# Patient Record
Sex: Female | Born: 1960 | Race: Black or African American | Hispanic: No | Marital: Married | Smoking: Never smoker
Health system: Southern US, Community
[De-identification: ages and names within clinical notes are randomized; demographics above are authoritative.]

## PROBLEM LIST (undated history)

## (undated) DIAGNOSIS — R0600 Dyspnea, unspecified: Secondary | ICD-10-CM

## (undated) DIAGNOSIS — I10 Essential (primary) hypertension: Secondary | ICD-10-CM

## (undated) DIAGNOSIS — E785 Hyperlipidemia, unspecified: Secondary | ICD-10-CM

## (undated) DIAGNOSIS — M5430 Sciatica, unspecified side: Secondary | ICD-10-CM

## (undated) HISTORY — PX: BREAST SURGERY: SHX581

## (undated) HISTORY — PX: ABDOMINAL HYSTERECTOMY: SHX81

---

## 2000-07-13 ENCOUNTER — Emergency Department (HOSPITAL_COMMUNITY): Admission: EM | Admit: 2000-07-13 | Discharge: 2000-07-13 | Payer: Self-pay | Admitting: Internal Medicine

## 2004-12-05 ENCOUNTER — Emergency Department (HOSPITAL_COMMUNITY): Admission: EM | Admit: 2004-12-05 | Discharge: 2004-12-06 | Payer: Self-pay | Admitting: Emergency Medicine

## 2006-11-02 ENCOUNTER — Other Ambulatory Visit: Admission: RE | Admit: 2006-11-02 | Discharge: 2006-11-02 | Payer: Self-pay | Admitting: Family Medicine

## 2008-07-07 ENCOUNTER — Emergency Department (HOSPITAL_COMMUNITY): Admission: EM | Admit: 2008-07-07 | Discharge: 2008-07-07 | Payer: Self-pay | Admitting: Emergency Medicine

## 2008-10-31 ENCOUNTER — Other Ambulatory Visit: Admission: RE | Admit: 2008-10-31 | Discharge: 2008-10-31 | Payer: Self-pay | Admitting: Family Medicine

## 2009-11-19 ENCOUNTER — Encounter (INDEPENDENT_AMBULATORY_CARE_PROVIDER_SITE_OTHER): Payer: Self-pay | Admitting: Obstetrics and Gynecology

## 2009-11-19 ENCOUNTER — Inpatient Hospital Stay (HOSPITAL_COMMUNITY): Admission: RE | Admit: 2009-11-19 | Discharge: 2009-11-21 | Payer: Self-pay | Admitting: Obstetrics and Gynecology

## 2010-07-31 ENCOUNTER — Encounter: Admission: RE | Admit: 2010-07-31 | Discharge: 2010-07-31 | Payer: Self-pay | Admitting: Family Medicine

## 2010-12-09 LAB — CBC
HCT: 29.9 % — ABNORMAL LOW (ref 36.0–46.0)
HCT: 31.1 % — ABNORMAL LOW (ref 36.0–46.0)
Hemoglobin: 10.3 g/dL — ABNORMAL LOW (ref 12.0–15.0)
MCHC: 33.2 g/dL (ref 30.0–36.0)
MCV: 95.8 fL (ref 78.0–100.0)
Platelets: 181 10*3/uL (ref 150–400)
RBC: 3.09 MIL/uL — ABNORMAL LOW (ref 3.87–5.11)
RBC: 3.25 MIL/uL — ABNORMAL LOW (ref 3.87–5.11)
RDW: 13.1 % (ref 11.5–15.5)
WBC: 9.9 10*3/uL (ref 4.0–10.5)

## 2010-12-09 LAB — BASIC METABOLIC PANEL
BUN: 11 mg/dL (ref 6–23)
Calcium: 9.1 mg/dL (ref 8.4–10.5)
Creatinine, Ser: 0.8 mg/dL (ref 0.4–1.2)

## 2010-12-09 LAB — TYPE AND SCREEN: ABO/RH(D): O POS

## 2010-12-09 LAB — ABO/RH: ABO/RH(D): O POS

## 2011-06-17 LAB — COMPREHENSIVE METABOLIC PANEL
CO2: 24
Calcium: 9.2
Creatinine, Ser: 0.99
GFR calc non Af Amer: >60
Glucose, Bld: 87

## 2011-06-17 LAB — LIPASE, BLOOD: Lipase: 23

## 2011-06-17 LAB — URINALYSIS, ROUTINE W REFLEX MICROSCOPIC
Bilirubin Urine: NEGATIVE
Glucose, UA: NEGATIVE
Nitrite: NEGATIVE
Specific Gravity, Urine: 1.022
pH: 5.5

## 2011-06-17 LAB — URINE MICROSCOPIC-ADD ON

## 2011-06-17 LAB — POCT CARDIAC MARKERS
CKMB, poc: <1 — ABNORMAL LOW
Myoglobin, poc: 53.6
Troponin i, poc: <0.05

## 2011-08-29 ENCOUNTER — Other Ambulatory Visit: Payer: Self-pay | Admitting: Obstetrics and Gynecology

## 2011-08-29 DIAGNOSIS — Z1231 Encounter for screening mammogram for malignant neoplasm of breast: Secondary | ICD-10-CM

## 2011-10-02 ENCOUNTER — Ambulatory Visit: Payer: Self-pay

## 2011-11-06 ENCOUNTER — Other Ambulatory Visit: Payer: Self-pay | Admitting: Family Medicine

## 2011-11-06 DIAGNOSIS — N6310 Unspecified lump in the right breast, unspecified quadrant: Secondary | ICD-10-CM

## 2011-11-28 ENCOUNTER — Ambulatory Visit
Admission: RE | Admit: 2011-11-28 | Discharge: 2011-11-28 | Disposition: A | Discharge status: Home / Self Care | Payer: Self-pay | Source: Ambulatory Visit | Attending: Family Medicine | Admitting: Family Medicine

## 2011-11-28 ENCOUNTER — Ambulatory Visit
Admission: RE | Admit: 2011-11-28 | Discharge: 2011-11-28 | Disposition: A | Discharge status: Home / Self Care | Payer: 59 | Source: Ambulatory Visit | Attending: Family Medicine | Admitting: Family Medicine

## 2011-11-28 ENCOUNTER — Other Ambulatory Visit: Payer: Self-pay | Admitting: Obstetrics and Gynecology

## 2011-11-28 DIAGNOSIS — N6310 Unspecified lump in the right breast, unspecified quadrant: Secondary | ICD-10-CM

## 2011-11-28 DIAGNOSIS — Z1231 Encounter for screening mammogram for malignant neoplasm of breast: Secondary | ICD-10-CM

## 2011-12-04 ENCOUNTER — Ambulatory Visit
Admission: RE | Admit: 2011-12-04 | Discharge: 2011-12-04 | Disposition: A | Discharge status: Home / Self Care | Payer: 59 | Source: Ambulatory Visit | Attending: Obstetrics and Gynecology | Admitting: Obstetrics and Gynecology

## 2011-12-04 DIAGNOSIS — Z1231 Encounter for screening mammogram for malignant neoplasm of breast: Secondary | ICD-10-CM

## 2011-12-05 ENCOUNTER — Other Ambulatory Visit: Payer: 59

## 2012-01-12 ENCOUNTER — Other Ambulatory Visit: Payer: Self-pay | Admitting: Obstetrics and Gynecology

## 2012-01-12 DIAGNOSIS — N644 Mastodynia: Secondary | ICD-10-CM

## 2012-01-13 ENCOUNTER — Ambulatory Visit
Admission: RE | Admit: 2012-01-13 | Discharge: 2012-01-13 | Disposition: A | Discharge status: Home / Self Care | Payer: 59 | Source: Ambulatory Visit | Attending: Obstetrics and Gynecology | Admitting: Obstetrics and Gynecology

## 2012-01-13 DIAGNOSIS — N644 Mastodynia: Secondary | ICD-10-CM

## 2012-09-21 ENCOUNTER — Other Ambulatory Visit: Payer: Self-pay | Admitting: Family Medicine

## 2012-09-21 DIAGNOSIS — N63 Unspecified lump in unspecified breast: Secondary | ICD-10-CM

## 2012-09-29 ENCOUNTER — Ambulatory Visit
Admission: RE | Admit: 2012-09-29 | Discharge: 2012-09-29 | Disposition: A | Discharge status: Home / Self Care | Payer: 59 | Source: Ambulatory Visit | Attending: Family Medicine | Admitting: Family Medicine

## 2012-09-29 ENCOUNTER — Other Ambulatory Visit: Payer: Self-pay | Admitting: Family Medicine

## 2012-09-29 DIAGNOSIS — N63 Unspecified lump in unspecified breast: Secondary | ICD-10-CM

## 2013-07-01 ENCOUNTER — Other Ambulatory Visit: Payer: Self-pay | Admitting: Cardiology

## 2013-07-01 ENCOUNTER — Ambulatory Visit
Admission: RE | Admit: 2013-07-01 | Discharge: 2013-07-01 | Disposition: A | Discharge status: Home / Self Care | Payer: 59 | Source: Ambulatory Visit | Attending: Cardiology | Admitting: Cardiology

## 2013-07-01 DIAGNOSIS — R0609 Other forms of dyspnea: Secondary | ICD-10-CM

## 2013-07-07 ENCOUNTER — Institutional Professional Consult (permissible substitution): Payer: 59 | Admitting: Cardiology

## 2016-01-09 DIAGNOSIS — N62 Hypertrophy of breast: Secondary | ICD-10-CM | POA: Insufficient documentation

## 2016-12-05 DIAGNOSIS — E7800 Pure hypercholesterolemia, unspecified: Secondary | ICD-10-CM | POA: Diagnosis not present

## 2016-12-05 DIAGNOSIS — I10 Essential (primary) hypertension: Secondary | ICD-10-CM | POA: Diagnosis not present

## 2016-12-05 DIAGNOSIS — J301 Allergic rhinitis due to pollen: Secondary | ICD-10-CM | POA: Diagnosis not present

## 2016-12-31 DIAGNOSIS — E7800 Pure hypercholesterolemia, unspecified: Secondary | ICD-10-CM | POA: Diagnosis not present

## 2016-12-31 DIAGNOSIS — D538 Other specified nutritional anemias: Secondary | ICD-10-CM | POA: Diagnosis not present

## 2017-01-01 ENCOUNTER — Encounter (HOSPITAL_COMMUNITY): Payer: Self-pay

## 2017-01-01 ENCOUNTER — Emergency Department (HOSPITAL_COMMUNITY): Payer: Commercial Managed Care - HMO

## 2017-01-01 DIAGNOSIS — R0602 Shortness of breath: Secondary | ICD-10-CM | POA: Diagnosis not present

## 2017-01-01 DIAGNOSIS — R0789 Other chest pain: Secondary | ICD-10-CM | POA: Diagnosis not present

## 2017-01-01 DIAGNOSIS — Z79899 Other long term (current) drug therapy: Secondary | ICD-10-CM | POA: Insufficient documentation

## 2017-01-01 DIAGNOSIS — I10 Essential (primary) hypertension: Secondary | ICD-10-CM | POA: Diagnosis not present

## 2017-01-01 DIAGNOSIS — R079 Chest pain, unspecified: Secondary | ICD-10-CM | POA: Diagnosis present

## 2017-01-01 LAB — BASIC METABOLIC PANEL
ANION GAP: 10 (ref 5–15)
BUN: 16 mg/dL (ref 6–20)
CALCIUM: 9.7 mg/dL (ref 8.9–10.3)
CO2: 24 mmol/L (ref 22–32)
Chloride: 105 mmol/L (ref 101–111)
Creatinine, Ser: 0.87 mg/dL (ref 0.44–1.00)
GFR calc non Af Amer: >60 mL/min (ref >60)
GLUCOSE: 89 mg/dL (ref 65–99)
Potassium: 4 mmol/L (ref 3.5–5.1)
Sodium: 139 mmol/L (ref 135–145)

## 2017-01-01 LAB — CBC
HCT: 34.4 % — ABNORMAL LOW (ref 36.0–46.0)
HEMOGLOBIN: 11.2 g/dL — AB (ref 12.0–15.0)
MCH: 30.3 pg (ref 26.0–34.0)
MCHC: 32.6 g/dL (ref 30.0–36.0)
MCV: 93 fL (ref 78.0–100.0)
Platelets: 259 10*3/uL (ref 150–400)
RBC: 3.7 MIL/uL — ABNORMAL LOW (ref 3.87–5.11)
RDW: 12.1 % (ref 11.5–15.5)
WBC: 5.1 10*3/uL (ref 4.0–10.5)

## 2017-01-01 LAB — TROPONIN I

## 2017-01-01 NOTE — ED Triage Notes (Signed)
Pt endorses left sided chest pain with radiation to the back and left hand numbness that began last evening. Pt denies any other associated sx. VSS.

## 2017-01-02 ENCOUNTER — Encounter (HOSPITAL_COMMUNITY): Payer: Self-pay | Admitting: Internal Medicine

## 2017-01-02 ENCOUNTER — Observation Stay (HOSPITAL_BASED_OUTPATIENT_CLINIC_OR_DEPARTMENT_OTHER): Payer: Commercial Managed Care - HMO

## 2017-01-02 ENCOUNTER — Observation Stay (HOSPITAL_COMMUNITY)
Admission: EM | Admit: 2017-01-02 | Discharge: 2017-01-02 | Disposition: A | Discharge status: Home / Self Care | Payer: Commercial Managed Care - HMO | Attending: Internal Medicine | Admitting: Internal Medicine

## 2017-01-02 DIAGNOSIS — E7800 Pure hypercholesterolemia, unspecified: Secondary | ICD-10-CM | POA: Diagnosis not present

## 2017-01-02 DIAGNOSIS — Z79899 Other long term (current) drug therapy: Secondary | ICD-10-CM | POA: Diagnosis not present

## 2017-01-02 DIAGNOSIS — R079 Chest pain, unspecified: Secondary | ICD-10-CM | POA: Diagnosis not present

## 2017-01-02 DIAGNOSIS — M5431 Sciatica, right side: Secondary | ICD-10-CM

## 2017-01-02 DIAGNOSIS — I10 Essential (primary) hypertension: Secondary | ICD-10-CM | POA: Diagnosis not present

## 2017-01-02 DIAGNOSIS — E785 Hyperlipidemia, unspecified: Secondary | ICD-10-CM | POA: Diagnosis present

## 2017-01-02 DIAGNOSIS — M5430 Sciatica, unspecified side: Secondary | ICD-10-CM | POA: Diagnosis present

## 2017-01-02 DIAGNOSIS — R0789 Other chest pain: Secondary | ICD-10-CM | POA: Diagnosis not present

## 2017-01-02 HISTORY — DX: Essential (primary) hypertension: I10

## 2017-01-02 HISTORY — DX: Sciatica, unspecified side: M54.30

## 2017-01-02 HISTORY — DX: Hyperlipidemia, unspecified: E78.5

## 2017-01-02 LAB — CREATININE, SERUM: CREATININE: 0.85 mg/dL (ref 0.44–1.00)

## 2017-01-02 LAB — LIPID PANEL
CHOL/HDL RATIO: 2.6 ratio
Cholesterol: 180 mg/dL (ref 0–200)
HDL: 70 mg/dL (ref >40)
LDL CALC: 104 mg/dL — AB (ref 0–99)
TRIGLYCERIDES: 31 mg/dL (ref <150)
VLDL: 6 mg/dL (ref 0–40)

## 2017-01-02 LAB — IRON AND TIBC
Iron: 74 ug/dL (ref 28–170)
Saturation Ratios: 22 % (ref 10.4–31.8)
TIBC: 332 ug/dL (ref 250–450)
UIBC: 258 ug/dL

## 2017-01-02 LAB — NM MYOCAR MULTI W/SPECT W/WALL MOTION / EF
CHL CUP MPHR: 165 {beats}/min
CSEPHR: 59 %
Exercise duration (min): 6 min
Exercise duration (sec): 53 s
Peak HR: 98 {beats}/min
Rest HR: 59 {beats}/min

## 2017-01-02 LAB — CBC
HCT: 32.1 % — ABNORMAL LOW (ref 36.0–46.0)
Hemoglobin: 10.4 g/dL — ABNORMAL LOW (ref 12.0–15.0)
MCH: 30 pg (ref 26.0–34.0)
MCHC: 32.4 g/dL (ref 30.0–36.0)
MCV: 92.5 fL (ref 78.0–100.0)
PLATELETS: 245 10*3/uL (ref 150–400)
RBC: 3.47 MIL/uL — ABNORMAL LOW (ref 3.87–5.11)
RDW: 12.2 % (ref 11.5–15.5)
WBC: 4.2 10*3/uL (ref 4.0–10.5)

## 2017-01-02 LAB — FERRITIN: Ferritin: 62 ng/mL (ref 11–307)

## 2017-01-02 LAB — HIV ANTIBODY (ROUTINE TESTING W REFLEX): HIV SCREEN 4TH GENERATION: NONREACTIVE

## 2017-01-02 LAB — TROPONIN I: Troponin I: <0.03 ng/mL (ref <0.03)

## 2017-01-02 MED ORDER — TECHNETIUM TC 99M TETROFOSMIN IV KIT
10.0000 | PACK | Freq: Once | INTRAVENOUS | Status: AC | PRN
  Administered 2017-01-02: 10 via INTRAVENOUS

## 2017-01-02 MED ORDER — ONDANSETRON HCL 4 MG/2ML IJ SOLN: 4.0000 mg | Freq: Four times a day (QID) | INTRAMUSCULAR | Status: DC | PRN

## 2017-01-02 MED ORDER — ATORVASTATIN CALCIUM 20 MG PO TABS: 20.0000 mg | ORAL_TABLET | Freq: Every day | ORAL | Status: DC

## 2017-01-02 MED ORDER — ASPIRIN EC 325 MG PO TBEC
325.0000 mg | DELAYED_RELEASE_TABLET | Freq: Every day | ORAL | Status: DC
  Administered 2017-01-02: 325 mg via ORAL
  Filled 2017-01-02: qty 1

## 2017-01-02 MED ORDER — AMLODIPINE BESYLATE 10 MG PO TABS: 10.0000 mg | ORAL_TABLET | Freq: Every day | ORAL | 0 refills | Status: AC

## 2017-01-02 MED ORDER — ACETAMINOPHEN 325 MG PO TABS: 650.0000 mg | ORAL_TABLET | ORAL | Status: DC | PRN

## 2017-01-02 MED ORDER — GI COCKTAIL ~~LOC~~: 30.0000 mL | Freq: Four times a day (QID) | ORAL | Status: DC | PRN

## 2017-01-02 MED ORDER — METOPROLOL TARTRATE 12.5 MG HALF TABLET
12.5000 mg | ORAL_TABLET | Freq: Two times a day (BID) | ORAL | Status: DC
Start: 2017-01-02 — End: 2017-01-02
  Administered 2017-01-02: 12.5 mg via ORAL
  Filled 2017-01-02: qty 1

## 2017-01-02 MED ORDER — ENOXAPARIN SODIUM 40 MG/0.4ML ~~LOC~~ SOLN
40.0000 mg | SUBCUTANEOUS | Status: DC
Start: 2017-01-02 — End: 2017-01-02
  Administered 2017-01-02: 40 mg via SUBCUTANEOUS
  Filled 2017-01-02: qty 0.4

## 2017-01-02 MED ORDER — METOPROLOL TARTRATE 25 MG PO TABS: 25.0000 mg | ORAL_TABLET | Freq: Two times a day (BID) | ORAL | 0 refills | Status: DC

## 2017-01-02 MED ORDER — TECHNETIUM TC 99M TETROFOSMIN IV KIT: 30.0000 | PACK | Freq: Once | INTRAVENOUS | Status: DC | PRN

## 2017-01-02 MED ORDER — REGADENOSON 0.4 MG/5ML IV SOLN
0.4000 mg | Freq: Once | INTRAVENOUS | Status: DC
  Filled 2017-01-02: qty 5

## 2017-01-02 MED ORDER — AMLODIPINE BESYLATE 5 MG PO TABS
5.0000 mg | ORAL_TABLET | Freq: Every day | ORAL | Status: DC
  Administered 2017-01-02: 5 mg via ORAL
  Filled 2017-01-02: qty 1

## 2017-01-02 MED ORDER — REGADENOSON 0.4 MG/5ML IV SOLN
INTRAVENOUS | Status: AC
  Filled 2017-01-02: qty 5

## 2017-01-02 MED ORDER — AMLODIPINE BESYLATE 10 MG PO TABS: 10.0000 mg | ORAL_TABLET | Freq: Every day | ORAL | Status: DC

## 2017-01-02 MED ORDER — NITROGLYCERIN 0.4 MG SL SUBL
0.4000 mg | SUBLINGUAL_TABLET | SUBLINGUAL | Status: DC | PRN
  Administered 2017-01-02: 0.4 mg via SUBLINGUAL
  Filled 2017-01-02: qty 1

## 2017-01-02 MED ORDER — ASPIRIN 81 MG PO CHEW
324.0000 mg | CHEWABLE_TABLET | Freq: Once | ORAL | Status: AC
  Administered 2017-01-02: 324 mg via ORAL
  Filled 2017-01-02: qty 4

## 2017-01-02 MED ORDER — SODIUM CHLORIDE 0.9 % IV SOLN
INTRAVENOUS | Status: AC
  Administered 2017-01-02: 75 mL/h via INTRAVENOUS

## 2017-01-02 MED ORDER — ASPIRIN EC 81 MG PO TBEC: 81.0000 mg | DELAYED_RELEASE_TABLET | Freq: Every day | ORAL | 0 refills | Status: DC

## 2017-01-02 NOTE — Discharge Instructions (Signed)
Follow with Primary MD  in 7 days  ° °Get CBC, CMP, 2 view Chest X ray checked  by Primary MD or SNF MD in 5-7 days ( we routinely change or add medications that can affect your baseline labs and fluid status, therefore we recommend that you get the mentioned basic workup next visit with your PCP, your PCP may decide not to get them or add new tests based on their clinical decision) ° °Activity: As tolerated with Full fall precautions use walker/cane & assistance as needed ° °Disposition Home   ° °Diet:   Heart Healthy   ° °For Heart failure patients - Check your Weight same time everyday, if you gain over 2 pounds, or you develop in leg swelling, experience more shortness of breath or chest pain, call your Primary MD immediately. Follow Cardiac Low Salt Diet and 1.5 lit/day fluid restriction. ° °On your next visit with your primary care physician please Get Medicines reviewed and adjusted. ° °Please request your Prim.MD to go over all Hospital Tests and Procedure/Radiological results at the follow up, please get all Hospital records sent to your Prim MD by signing hospital release before you go home. ° °If you experience worsening of your admission symptoms, develop shortness of breath, life threatening emergency, suicidal or homicidal thoughts you must seek medical attention immediately by calling 911 or calling your MD immediately  if symptoms less severe. ° °You Must read complete instructions/literature along with all the possible adverse reactions/side effects for all the Medicines you take and that have been prescribed to you. Take any new Medicines after you have completely understood and accpet all the possible adverse reactions/side effects.  ° °Do not drive, operate heavy machinery, perform activities at heights, swimming or participation in water activities or provide baby sitting services if your were admitted for syncope or siezures until you have seen by Primary MD or a Neurologist and advised to do  so again. ° °Do not drive when taking Pain medications.  ° ° °Do not take more than prescribed Pain, Sleep and Anxiety Medications ° °Special Instructions: If you have smoked or chewed Tobacco  in the last 2 yrs please stop smoking, stop any regular Alcohol  and or any Recreational drug use. ° °Wear Seat belts while driving. ° ° °Please note ° °You were cared for by a hospitalist during your hospital stay. If you have any questions about your discharge medications or the care you received while you were in the hospital after you are discharged, you can call the unit and asked to speak with the hospitalist on call if the hospitalist that took care of you is not available. Once you are discharged, your primary care physician will handle any further medical issues. Please note that NO REFILLS for any discharge medications will be authorized once you are discharged, as it is imperative that you return to your primary care physician (or establish a relationship with a primary care physician if you do not have one) for your aftercare needs so that they can reassess your need for medications and monitor your lab values. ° °

## 2017-01-02 NOTE — Consult Note (Signed)
Cardiology Consult Note  Admit date: 01/02/2017 Name: Claudia Barrett 56 y.o.  female DOB:  1961-04-01 MRN:  [id number]  Today's date:  01/02/2017  Referring Physician:    [facility]  Reason for Consultation:   Chest pain   IMPRESSIONS: 1.  Chest pain with some typical other atypical symptoms with negative initial enzymes and EKG-she has had a stress test done and await results at this time 2.  Hypertensive heart disease 3.  Hyperlipidemia under treatment 4.  Anemia of uncertain etiology  RECOMMENDATION: Await results of stress testing.  She had a negative treadmill test last year.  The stress test done with Eugenie Birks is pending.  I will review the stress test.  Her blood pressure is mildly elevated and if the stress test is low risk we could intensify medical therapy and follow.  If she has abnormalities on the stress test will need to proceed with further investigation.  HISTORY: This very nice 56 year old [place] female was seen by me last summer.  At the time she had a normal stress test but only walked 6 minutes.  She was obese but has lost 49 pounds voluntarily since then.  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  She had recurrence of chest pain with some radiation to her posterior back and some numbness in her left arm.  She also had some pain involving her feet.  Office but we did not have an opening to see her ester day.  She was advised to go to the emergency room if she had recurrent pain and she had some numbness in her left hand as well as recurrent discomfort last night.  She has some associated sweating with that and had a nitroglycerin that improved her symptoms.  She has had no recurrent symptoms since then and has had a normal EKG twice as well as negative troponins.  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  She normally denies PND orthopnea or edema.    Past Medical History:   Diagnosis Date  . HLD (hyperlipidemia)   . Hypertension   . Sciatica       Past Surgical History:  Procedure Laterality Date  . ABDOMINAL HYSTERECTOMY    . BREAST SURGERY      Allergies:  is allergic to codeine and sulfur.   Medications: Prior to Admission medications   Medication Sig Start Date End Date Taking? Authorizing Provider  amLODipine (NORVASC) 5 MG tablet Take 5 mg by mouth daily. 10/11/15  Yes Historical Provider, MD  atorvastatin (LIPITOR) 20 MG tablet Take 20 mg by mouth daily. 12/03/16  Yes Historical Provider, MD  meloxicam (MOBIC) 15 MG tablet Take 15 mg by mouth daily. 12/01/16  Yes Historical Provider, MD    Family History: Family Status  Relation Status  . Mother   . Father   . Neg Hx     Social History:   reports that she has never smoked. She has never used smokeless tobacco. She reports that she does not drink alcohol or use drugs.   Review of Systems: Other than as noted above remainder review of systems is negative  Physical Exam: BP (!) 148/79   Pulse 94   Temp 97.6 F (36.4 C) (Oral)   Resp 17   Ht  (1.676 m)   Wt 84.1 kg (185 lb 6.5 oz)   SpO2 100%   BMI 29.93 kg/m   General appearance: Pleasant [place] female  in no acute distress Head: Normocephalic, without obvious abnormality, atraumatic Eyes: conjunctivae/corneas clear. PERRL, EOM's intact. Fundi not examined Neck: no adenopathy, no carotid bruit, no JVD and supple, symmetrical, trachea midline Lungs: clear to auscultation bilaterally Heart: regular rate and rhythm, S1, S2 normal, no murmur, click, rub or gallop Abdomen: soft, non-tender; bowel sounds normal; no masses,  no organomegaly Pelvic: deferred Extremities: extremities normal, atraumatic, no cyanosis or edema Pulses: 2+ and symmetric Skin: Skin color, texture, turgor normal. No rashes or lesions Neurologic: Grossly normal Psych: Alert and oriented x 3 Labs: CBC  Recent Labs  01/02/17 0522  WBC 4.2  RBC 3.47*   HGB 10.4*  HCT 32.1*  PLT 245  MCV 92.5  MCH 30.0  MCHC 32.4  RDW 12.2   CMP   Recent Labs  01/01/17 2048 01/02/17 0522  NA 139  --   K 4.0  --   CL 105  --   CO2 24  --   GLUCOSE 89  --   BUN 16  --   CREATININE 0.87 0.85  CALCIUM 9.7  --   GFRNONAA >60 >60  GFRAA >60 >60   Cardiac Panel (last 3 results)  Recent Labs  01/01/17 2048 01/02/17 0522  TROPONINI <0.03 <0.03  <0.03     Radiology:  Normal  EKG: Sinus rhythm, normal Independently reviewed by me  Signed:  W. Ashley Royalty MD [facility]   Cardiology Consultant  01/02/2017, 1:35 PM

## 2017-01-02 NOTE — Progress Notes (Signed)
Pt received discharge information and education. Pt verbalized understanding. Family at bedside.

## 2017-01-02 NOTE — ED Provider Notes (Signed)
[facility]-EMERGENCY DEPT Provider Note   CSN: [id number] Arrival date & time: 01/01/17  2035  By signing my name below, I, Elder Negus, attest that this documentation has been prepared under the direction and in the presence of Gilda Crease, MD. Electronically Signed: Elder Negus, Scribe. 01/02/17. 12:32 AM.   History   Chief Complaint Chief Complaint  Patient presents with  . Chest Pain    HPI Claudia Barrett is a 56 y.o. female with history of hypertension who presents to the ED for evaluation of chest pain. This patient states that in the last 24-36 hours she has experienced exertional, central chest pain without radiation or associated symptoms. Not worse when palpating or moving her arm. She states that today while "sweeping" her chest pain worsened; she "rested for a few minutes" and her symptoms resolved. At interview while at rest, she states that her symptoms are mild. She denies any cardiac history. Grandmother had prior MI; no immediate family. She is not a tobacco user. She did have a stress test around 1 year ago that demonstrated "a leaky valve and fat on the heart". No ischemia.  The history is provided by the patient. No language interpreter was used.    Past Medical History:  Diagnosis Date  . Hypertension     There are no active problems to display for this patient.   Past Surgical History:  Procedure Laterality Date  . ABDOMINAL HYSTERECTOMY    . BREAST SURGERY      OB History    No data available       Home Medications    Prior to Admission medications   Medication Sig Start Date End Date Taking? Authorizing Provider  amLODipine (NORVASC) 5 MG tablet Take 5 mg by mouth daily. 10/11/15  Yes Historical Provider, MD  atorvastatin (LIPITOR) 20 MG tablet Take 20 mg by mouth daily. 12/03/16  Yes Historical Provider, MD  meloxicam (MOBIC) 15 MG tablet Take 15 mg by mouth daily. 12/01/16  Yes Historical Provider, MD    Family  History History reviewed. No pertinent family history.  Social History Social History  Substance Use Topics  . Smoking status: Never Smoker  . Smokeless tobacco: Never Used  . Alcohol use No     Allergies   Codeine and Sulfur   Review of Systems Review of Systems  Respiratory: Negative for shortness of breath.   Cardiovascular: Positive for chest pain.  All other systems reviewed and are negative.    Physical Exam Updated Vital Signs BP (!) 143/77   Pulse 68   Temp 98.2 F (36.8 C) (Oral)   Resp 13   Ht  (1.676 m)   Wt 187 lb (84.8 kg)   SpO2 100%   BMI 30.18 kg/m   Physical Exam  Constitutional: She is oriented to person, place, and time. She appears well-developed and well-nourished. No distress.  HENT:  Head: Normocephalic and atraumatic.  Right Ear: Hearing normal.  Left Ear: Hearing normal.  Nose: Nose normal.  Mouth/Throat: Oropharynx is clear and moist and mucous membranes are normal.  Eyes: Conjunctivae and EOM are normal. Pupils are equal, round, and reactive to light.  Neck: Normal range of motion. Neck supple.  Cardiovascular: Regular rhythm, S1 normal and S2 normal.  Exam reveals no gallop and no friction rub.   No murmur heard. Pulmonary/Chest: Effort normal and breath sounds normal. No respiratory distress. She exhibits no tenderness.  Abdominal: Soft. Normal appearance and bowel sounds are normal. There is  no hepatosplenomegaly. There is no tenderness. There is no rebound, no guarding, no tenderness at McBurney's point and negative Murphy's sign. No hernia.  Musculoskeletal: Normal range of motion.  Neurological: She is alert and oriented to person, place, and time. She has normal strength. No cranial nerve deficit or sensory deficit. Coordination normal. GCS eye subscore is 4. GCS verbal subscore is 5. GCS motor subscore is 6.  Skin: Skin is warm, dry and intact. No rash noted. No cyanosis.  Psychiatric: She has a normal mood and affect. Her  speech is normal and behavior is normal. Thought content normal.  Nursing note and vitals reviewed.    ED Treatments / Results  Labs (all labs ordered are listed, but only abnormal results are displayed) Labs Reviewed  CBC - Abnormal; Notable for the following:       Result Value   RBC 3.70 (*)    Hemoglobin 11.2 (*)    HCT 34.4 (*)    All other components within normal limits  BASIC METABOLIC PANEL  TROPONIN I    EKG  EKG Interpretation  Date/Time:  Thursday January 01 2017 20:41:33 EDT Ventricular Rate:  66 PR Interval:  164 QRS Duration: 74 QT Interval:  420 QTC Calculation: 440 R Axis:   19 Text Interpretation:  Normal sinus rhythm Low voltage QRS Borderline ECG No significant change since last tracing Confirmed by Breyson Kelm  MD, Sammi Stolarz [phone]) on 01/02/2017 1:23:16 AM       Radiology Dg Chest 2 View  Result Date: 01/01/2017 CLINICAL DATA:  Central chest pain and shortness of breath for 1 day. History of hypertension. Nonsmoker. EXAM: CHEST  2 VIEW COMPARISON:  07/01/2013 FINDINGS: The heart size and mediastinal contours are within normal limits. Both lungs are clear. The visualized skeletal structures are unremarkable. IMPRESSION: No active cardiopulmonary disease. Electronically Signed   By: Burman Nieves M.D.   On: 01/01/2017 21:13    Procedures Procedures (including critical care time)  Medications Ordered in ED Medications  nitroGLYCERIN (NITROSTAT) SL tablet 0.4 mg (0.4 mg Sublingual Given 01/02/17 0058)  aspirin chewable tablet 324 mg (324 mg Oral Given 01/02/17 0057)     Initial Impression / Assessment and Plan / ED Course  I have reviewed the triage vital signs and the nursing notes.  Pertinent labs & imaging results that were available during my care of the patient were reviewed by me and considered in my medical decision making (see chart for details).     Patient presents to the emergency department for evaluation of chest pain. She has been  experiencing intermittent exertional chest pain through the course of today. She reports that while she was at work whenever she would get up and slightly exert herself, such as sweeping, she would develop left-sided chest pain with associated shortness of breath. Symptoms would improve if she sat down and rested for a while, and then recur when she exerted herself.  At arrival to the ER she still had some mild discomfort. She was given aspirin and nitroglycerin. Upon demonstration of nitroglycerin, chest discomfort resolved.  EKG does not show ischemic changes. Initial troponin is negative. Heart score is 4, will require further evaluation.  HEART Pathway for Early Discharge in Acute Chest Pain from [email or web address]  on 01/02/2017 ** All calculations should be rechecked by clinician prior to use **  RESULT SUMMARY: 4 points HEART Pathway Score  High risk 12-65% 30-day MACE  Admit to hospital or observation. Further testing indicated.   INPUTS: History ->  1 = Moderately suspicious (exertional pain) EKG -> 0 = Normal Age -> 1 = 45-64 Risk factors -> 2 = ?3 risk factors or history of atherosclerotic disease (HTN, Chol, obesity) Initial troponin -> 0 = ?normal limit   Final Clinical Impressions(s) / ED Diagnoses   Final diagnoses:  Chest pain, unspecified type    New Prescriptions New Prescriptions   No medications on file  I personally performed the services described in this documentation, which was scribed in my presence. The recorded information has been reviewed and is accurate.    Gilda Crease, MD 01/02/17 [phone]

## 2017-01-02 NOTE — Progress Notes (Addendum)
   Austin Miles Javid presented for a nuclear stress test today.  No immediate complications.  Stress imaging is pending at this time.  Preliminary EKG findings may be listed in the chart, but the stress test result will not be finalized until perfusion imaging is complete.  Pt had bigeminy and frequent PVCs during test. Would recommend checking Mg today, keep > 2.0 and K near 4.0.  Roe Rutherford Ludene Stokke, PA-C 01/02/2017, 10:39 AM

## 2017-01-02 NOTE — H&P (Signed)
History and Physical    Claudia Barrett:[id number] DOB: 08/22/1961 DOA: 01/02/2017  PCP: Dr. Neva Seat Patient coming from: Home   Chief Complaint: Chest pain  HPI: Claudia Barrett is a 56 y.o. female with medical history significant of HTN, HLD, sciatica who presents for chest pain.  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  The pain was in her left chest and radiated to the flank, jaw and down the arm with a numb feeling. The pain felt throbbing in nature and was a 10/10 at it's worst.  Resting helped the pain somewhat, but nitroglycerin given in the ED made the pain resolve.  She had some mild version of this pain yesterday also while exerting herself and this got better with rest.  She had associated diaphoresis, SOB and somnolence.  She has had blurry vision over the last 2 days.  She has not had dizziness and lightheadedness with this episode, but does occasionally have dizziness which causes her to trip.  She has chronic constipation for which she takes milk of magnesia.  She has a FH of her father with heart trouble and needing a pacemaker, but no MI or CAD as far as she knows.  She does not smoke.    ED Course: In the ED, she was given NTG with resolution of her pain.  She had an initial troponin which was normal.  She had a CXR which showed no active disease.  She had an EKG which showed low voltage, but NSR without TWI or ST changes.   Review of Systems: As per HPI otherwise 10 point review of systems negative.    Past Medical History:  Diagnosis Date  . HLD (hyperlipidemia)   . Hypertension   . Sciatica     Past Surgical History:  Procedure Laterality Date  . ABDOMINAL HYSTERECTOMY    . BREAST SURGERY     Reviewed with patient.   reports that she has never smoked. She has never used smokeless tobacco. She reports that she does not drink alcohol or use drugs.  Allergies  Allergen Reactions  . Codeine  Other (See Comments)    Extreme fatigue  . Sulfur Nausea And Vomiting   Reviewed with patient.  Family History  Problem Relation Age of Onset  . Hypertension Mother   . Arthritis Mother   . Arrhythmia Father     needed pacemaker  . Heart attack Neg Hx     Prior to Admission medications   Medication Sig Start Date End Date Taking? Authorizing Provider  amLODipine (NORVASC) 5 MG tablet Take 5 mg by mouth daily. 10/11/15  Yes Historical Provider, MD  atorvastatin (LIPITOR) 20 MG tablet Take 20 mg by mouth daily. 12/03/16  Yes Historical Provider, MD  meloxicam (MOBIC) 15 MG tablet Take 15 mg by mouth daily. 12/01/16  Yes Historical Provider, MD    Physical Exam: Vitals:   01/02/17 0300 01/02/17 0315 01/02/17 0333 01/02/17 0335  BP: 140/80 122/76  (!) 145/82  Pulse:      Resp: (!) 9 17    Temp:    97.7 F (36.5 C)  TempSrc:    Oral  SpO2:      Weight:   185 lb 6.5 oz (84.1 kg)   Height:        Constitutional: NAD, calm, comfortable, lying in bed Vitals:   01/02/17 0300 01/02/17 0315 01/02/17 0333 01/02/17 0335  BP: 140/80 122/76  Marland Kitchen)  145/82  Pulse:      Resp: (!) 9 17    Temp:    97.7 F (36.5 C)  TempSrc:    Oral  SpO2:      Weight:   185 lb 6.5 oz (84.1 kg)   Height:       Eyes: lids and conjunctivae normal, anicteric sclerae ENMT: Mucous membranes are moist. Normal dentition.  Neck: normal, supple, no carotid bruits Respiratory: clear to auscultation bilaterally, no wheezing, no crackles. Normal respiratory effort. Cardiovascular: Normal rate and regular rhythm, no murmurs / rubs / gallops. No extremity edema. 2+ pedal pulses.  Pain is not reproducible.  Abdomen: +BS, NT, ND Musculoskeletal: no clubbing / cyanosis.  Normal muscle tone. Artificial nails Skin: no rashes, lesions, ulcers.  Neurologic: CN grossly intact, non focal Psychiatric: Normal judgment and insight. Alert and oriented x 3. Normal mood.   Labs on Admission: I have personally reviewed following  labs and imaging studies  CBC:  Recent Labs Lab 01/01/17 2048  WBC 5.1  HGB 11.2*  HCT 34.4*  MCV 93.0  PLT 259   Basic Metabolic Panel:  Recent Labs Lab 01/01/17 2048  NA 139  K 4.0  CL 105  CO2 24  GLUCOSE 89  BUN 16  CREATININE 0.87  CALCIUM 9.7   GFR: Estimated Creatinine Clearance: 79.8 mL/min (by C-G formula based on SCr of 0.87 mg/dL). Liver Function Tests: No results for input(s): AST, ALT, ALKPHOS, BILITOT, PROT, ALBUMIN in the last 168 hours. No results for input(s): LIPASE, AMYLASE in the last 168 hours. No results for input(s): AMMONIA in the last 168 hours. Coagulation Profile: No results for input(s): INR, PROTIME in the last 168 hours. Cardiac Enzymes:  Recent Labs Lab 01/01/17 2048  TROPONINI <0.03   BNP (last 3 results) No results for input(s): PROBNP in the last 8760 hours. HbA1C: No results for input(s): HGBA1C in the last 72 hours. CBG: No results for input(s): GLUCAP in the last 168 hours. Lipid Profile: No results for input(s): CHOL, HDL, LDLCALC, TRIG, CHOLHDL, LDLDIRECT in the last 72 hours. Thyroid Function Tests: No results for input(s): TSH, T4TOTAL, FREET4, T3FREE, THYROIDAB in the last 72 hours. Anemia Panel: No results for input(s): VITAMINB12, FOLATE, FERRITIN, TIBC, IRON, RETICCTPCT in the last 72 hours. Urine analysis:    Component Value Date/Time   COLORURINE YELLOW 07/07/2008 0736   APPEARANCEUR HAZY (A) 07/07/2008 0736   LABSPEC 1.022 07/07/2008 0736   PHURINE 5.5 07/07/2008 0736   GLUCOSEU NEGATIVE 07/07/2008 0736   HGBUR NEGATIVE 07/07/2008 0736   BILIRUBINUR NEGATIVE 07/07/2008 0736   KETONESUR NEGATIVE 07/07/2008 0736   PROTEINUR NEGATIVE 07/07/2008 0736   UROBILINOGEN 0.2 07/07/2008 0736   NITRITE NEGATIVE 07/07/2008 0736   LEUKOCYTESUR MODERATE (A) 07/07/2008 0736    Radiological Exams on Admission: Dg Chest 2 View  Result Date: 01/01/2017 CLINICAL DATA:  Central chest pain and shortness of breath for  1 day. History of hypertension. Nonsmoker. EXAM: CHEST  2 VIEW COMPARISON:  07/01/2013 FINDINGS: The heart size and mediastinal contours are within normal limits. Both lungs are clear. The visualized skeletal structures are unremarkable. IMPRESSION: No active cardiopulmonary disease. Electronically Signed   By: Burman Nieves M.D.   On: 01/01/2017 21:13    EKG: Independently reviewed. low voltage, but NSR without TWI or ST changes  Assessment/Plan Exertional chest pain - Admit to obs for ACS rule out - Trend troponin - AM EKG and for recurrent chest pain - Nitro for pain, she is  allergic to codeine so morphine was not ordered - Telemetry - Consider stress test in AM, NPO - Start low dose beta blocker, aspirin - IVF with NS at 75cc/hr while NPO - GI cocktail    HLD (hyperlipidemia) - Continue home atorvastatin - Check lipid panel for risk stratification    Hypertension - BP mildly elevated, she is on amlodipine - continue amlodipine - Low dose beta blocker added    Sciatica - Hold meloxicam - Tylenol for pain  Mild normocytic anemia - Check iron, ferritin.  Treat if deficient.     DVT prophylaxis: Lovenox Code Status: Full Family Communication: Husband at bedside Disposition Plan: plan for 23 hour admission Consults called: None Admission status: Telemetry, obs   Debe Coder MD [facility] Pager [phone]  If 7PM-7AM, please contact night-coverage [email or web address] Password [email or web address]  01/02/2017, 5:35 AM

## 2017-01-02 NOTE — Plan of Care (Signed)
Problem: Pain Managment: Goal: General experience of comfort will improve Outcome: Progressing No c/o pain or discomfort since she arrived to [facility].

## 2017-01-02 NOTE — Discharge Summary (Signed)
Claudia Barrett ZOX:[id number] DOB: 10/05/1960 DOA: 01/02/2017  PCP: No PCP Per Patient  Admit date: 01/02/2017  Discharge date: 01/02/2017  Admitted From: Home   Disposition:  Home   Recommendations for Outpatient Follow-up:   Follow up with PCP in 1-2 weeks  PCP Please obtain BMP/CBC, 2 view CXR in 1week,  (see Discharge instructions)   PCP Please follow up on the following pending results: None   Home Health: None   Equipment/Devices: none  Consultations: Cards Discharge Condition: Stable   CODE STATUS: Full   Diet Recommendation:  Heart Healthy    Chief Complaint  Patient presents with  . Chest Pain     Brief history of present illness from the day of admission and additional interim summary    Claudia Barrett is a 56 y.o. female with medical history significant of HTN, HLD, sciatica who presents for chest pain.  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  The pain was in her left chest and radiated to the flank, jaw and down the arm with a numb feeling                                                                 Hospital Course    1. Chest pain - seen by cards, ruled out for MI, Lexiscan -ve, symptom free, will DC on ASA 81, Lopressor and Norvasc(HTN better control), was seen by Cards as well.  2.Dyslipidemia - on statin  3.HTN - poor control - Norvasc dose increased added Lopressor.    Discharge diagnosis     Active Problems:   HLD (hyperlipidemia)   Hypertension   Sciatica   Chest pain    Discharge instructions    Discharge Instructions    Diet - low sodium heart healthy    Complete by:  As directed    Discharge instructions    Complete by:  As directed    Follow with Primary MD  in 7 days   Get CBC, CMP, 2 view  Chest X ray checked  by Primary MD or SNF MD in 5-7 days ( we routinely change or add medications that can affect your baseline labs and fluid status, therefore we recommend that you get the mentioned basic workup next visit with your PCP, your PCP may decide not to get them or add new tests based on their clinical decision)  Activity: As tolerated with Full fall precautions use walker/cane & assistance as needed  Disposition Home    Diet: Heart Healthy    For Heart failure patients - Check your Weight same time everyday, if you gain over 2 pounds, or you develop in leg swelling, experience more shortness of breath or chest pain, call your Primary MD immediately. Follow Cardiac  Low Salt Diet and 1.5 lit/day fluid restriction.  On your next visit with your primary care physician please Get Medicines reviewed and adjusted.  Please request your Prim.MD to go over all Hospital Tests and Procedure/Radiological results at the follow up, please get all Hospital records sent to your Prim MD by signing hospital release before you go home.  If you experience worsening of your admission symptoms, develop shortness of breath, life threatening emergency, suicidal or homicidal thoughts you must seek medical attention immediately by calling 911 or calling your MD immediately  if symptoms less severe.  You Must read complete instructions/literature along with all the possible adverse reactions/side effects for all the Medicines you take and that have been prescribed to you. Take any new Medicines after you have completely understood and accpet all the possible adverse reactions/side effects.   Do not drive, operate heavy machinery, perform activities at heights, swimming or participation in water activities or provide baby sitting services if your were admitted for syncope or siezures until you have seen by Primary MD or a Neurologist and advised to do so again.  Do not drive when taking Pain medications.     Do not take more than prescribed Pain, Sleep and Anxiety Medications  Special Instructions: If you have smoked or chewed Tobacco  in the last 2 yrs please stop smoking, stop any regular Alcohol  and or any Recreational drug use.  Wear Seat belts while driving.   Please note  You were cared for by a hospitalist during your hospital stay. If you have any questions about your discharge medications or the care you received while you were in the hospital after you are discharged, you can call the unit and asked to speak with the hospitalist on call if the hospitalist that took care of you is not available. Once you are discharged, your primary care physician will handle any further medical issues. Please note that NO REFILLS for any discharge medications will be authorized once you are discharged, as it is imperative that you return to your primary care physician (or establish a relationship with a primary care physician if you do not have one) for your aftercare needs so that they can reassess your need for medications and monitor your lab values.   Increase activity slowly    Complete by:  As directed       Discharge Medications   Allergies as of 01/02/2017      Reactions   Codeine Other (See Comments)   Extreme fatigue   Sulfur Nausea And Vomiting      Medication List    STOP taking these medications   meloxicam 15 MG tablet Commonly known as:  MOBIC     TAKE these medications   amLODipine 10 MG tablet Commonly known as:  NORVASC Take 1 tablet (10 mg total) by mouth daily. What changed:  medication strength  how much to take   aspirin EC 81 MG tablet Take 1 tablet (81 mg total) by mouth daily.   atorvastatin 20 MG tablet Commonly known as:  LIPITOR Take 20 mg by mouth daily.   metoprolol tartrate 25 MG tablet Commonly known as:  LOPRESSOR Take 1 tablet (25 mg total) by mouth 2 (two) times daily.       Follow-up Information    W Viann Fish, MD. Schedule  an appointment as soon as possible for a visit in 1 week(s).   Specialty:  Cardiology Contact information: [address] [place] [address] [place]  [place] [address] [phone]           Major procedures and Radiology Reports - PLEASE review detailed and final reports thoroughly  -        Dg Chest 2 View  Result Date: 01/01/2017 CLINICAL DATA:  Central chest pain and shortness of breath for 1 day. History of hypertension. Nonsmoker. EXAM: CHEST  2 VIEW COMPARISON:  07/01/2013 FINDINGS: The heart size and mediastinal contours are within normal limits. Both lungs are clear. The visualized skeletal structures are unremarkable. IMPRESSION: No active cardiopulmonary disease. Electronically Signed   By: Burman Nieves M.D.   On: 01/01/2017 21:13   Nm Myocar Multi W/spect W/wall Motion / Ef  Result Date: 01/02/2017 CLINICAL DATA:  Chest pain, hypertension, elevated lipids, shortness of breath EXAM: MYOCARDIAL IMAGING WITH SPECT (REST AND PHARMACOLOGIC-STRESS) GATED LEFT VENTRICULAR WALL MOTION STUDY LEFT VENTRICULAR EJECTION FRACTION TECHNIQUE: Standard myocardial SPECT imaging was performed after resting intravenous injection of 10 mCi Tc-66m tetrofosmin. Subsequently, intravenous infusion of Lexiscan was performed under the supervision of the Cardiology staff. At peak effect of the drug, 30 mCi Tc-80m tetrofosmin was injected intravenously and standard myocardial SPECT imaging was performed. Quantitative gated imaging was also performed to evaluate left ventricular wall motion, and estimate left ventricular ejection fraction. COMPARISON:  None. FINDINGS: Perfusion: No decreased activity in the left ventricle on stress imaging to suggest reversible ischemia or infarction. Wall Motion: Normal left ventricular wall motion. No left ventricular dilation. Left Ventricular Ejection Fraction: 56 % End diastolic volume 100 ml End systolic volume 44 ml IMPRESSION: 1. No reversible ischemia or infarction. 2.  Normal left ventricular wall motion. 3. Left ventricular ejection fraction 56% 4. Non invasive risk stratification*: Low *2012 Appropriate Use Criteria for Coronary Revascularization Focused Update: J Am Coll Cardiol. 2012;59(9):857-881. http://[email or web address] Electronically Signed   By: Charline Bills M.D.   On: 01/02/2017 14:56    Micro Results    No results found for this or any previous visit (from the past 240 hour(s)).  Today   Subjective    Claudia Barrett today has no headache,no chest abdominal pain,no new weakness tingling or numbness, feels much better wants to go home today.    Objective   Blood pressure (!) 148/79, pulse 94, temperature 97.6 F (36.4 C), temperature source Oral, resp. rate 17, height  (1.676 m), weight 84.1 kg (185 lb 6.5 oz), SpO2 100 %.   Intake/Output Summary (Last 24 hours) at 01/02/17 1550 Last data filed at 01/02/17 1230  Gross per 24 hour  Intake           191.25 ml  Output              850 ml  Net          -658.75 ml    Exam Awake Alert, Oriented x 3, No new F.N deficits, Normal affect [place].AT,PERRAL Supple Neck,No JVD, No cervical lymphadenopathy appriciated.  Symmetrical Chest wall movement, Good air movement bilaterally, CTAB RRR,No Gallops,Rubs or new Murmurs, No Parasternal Heave +ve B.Sounds, Abd Soft, Non tender, No organomegaly appriciated, No rebound -guarding or rigidity. No Cyanosis, Clubbing or edema, No new Rash or bruise   Data Review   CBC w Diff: Lab Results  Component Value Date   WBC 4.2 01/02/2017   HGB 10.4 (L) 01/02/2017   HCT 32.1 (L) 01/02/2017   PLT 245 01/02/2017    CMP: Lab Results  Component Value Date   NA 139 01/01/2017   K 4.0 01/01/2017  CL 105 01/01/2017   CO2 24 01/01/2017   BUN 16 01/01/2017   CREATININE 0.85 01/02/2017   PROT 6.9 07/07/2008   ALBUMIN 3.9 07/07/2008   BILITOT 0.6 07/07/2008   ALKPHOS 35 (L) 07/07/2008   AST 20 07/07/2008    ALT 10 07/07/2008  .   Total Time in preparing paper work, data evaluation and todays exam - 35 minutes  Susa Raring M.D on 01/02/2017 at 3:50 PM  [facility]   Office  [phone]

## 2017-01-02 NOTE — ED Notes (Signed)
Patient is stable and ready to be transport to the floor at this time.  Report was called to 3W RN.  Belongings taken with the patient to the floor.   

## 2017-01-06 DIAGNOSIS — R079 Chest pain, unspecified: Secondary | ICD-10-CM | POA: Diagnosis not present

## 2017-01-06 DIAGNOSIS — R0789 Other chest pain: Secondary | ICD-10-CM | POA: Diagnosis not present

## 2017-01-06 DIAGNOSIS — I10 Essential (primary) hypertension: Secondary | ICD-10-CM | POA: Diagnosis not present

## 2017-01-06 DIAGNOSIS — I351 Nonrheumatic aortic (valve) insufficiency: Secondary | ICD-10-CM | POA: Diagnosis not present

## 2017-01-07 DIAGNOSIS — D538 Other specified nutritional anemias: Secondary | ICD-10-CM | POA: Diagnosis not present

## 2017-01-07 DIAGNOSIS — R0789 Other chest pain: Secondary | ICD-10-CM | POA: Diagnosis not present

## 2017-01-07 DIAGNOSIS — M5412 Radiculopathy, cervical region: Secondary | ICD-10-CM | POA: Diagnosis not present

## 2017-01-08 DIAGNOSIS — M4722 Other spondylosis with radiculopathy, cervical region: Secondary | ICD-10-CM | POA: Diagnosis not present

## 2017-01-12 DIAGNOSIS — D649 Anemia, unspecified: Secondary | ICD-10-CM | POA: Diagnosis not present

## 2017-01-15 DIAGNOSIS — M5412 Radiculopathy, cervical region: Secondary | ICD-10-CM | POA: Diagnosis not present

## 2017-01-16 DIAGNOSIS — D509 Iron deficiency anemia, unspecified: Secondary | ICD-10-CM | POA: Diagnosis not present

## 2017-01-16 DIAGNOSIS — K2930 Chronic superficial gastritis without bleeding: Secondary | ICD-10-CM | POA: Diagnosis not present

## 2017-01-16 DIAGNOSIS — R079 Chest pain, unspecified: Secondary | ICD-10-CM | POA: Diagnosis not present

## 2017-03-05 DIAGNOSIS — M4802 Spinal stenosis, cervical region: Secondary | ICD-10-CM | POA: Diagnosis not present

## 2017-03-13 DIAGNOSIS — R293 Abnormal posture: Secondary | ICD-10-CM | POA: Diagnosis not present

## 2017-03-13 DIAGNOSIS — M542 Cervicalgia: Secondary | ICD-10-CM | POA: Diagnosis not present

## 2017-03-13 DIAGNOSIS — M2560 Stiffness of unspecified joint, not elsewhere classified: Secondary | ICD-10-CM | POA: Diagnosis not present

## 2017-03-27 DIAGNOSIS — R293 Abnormal posture: Secondary | ICD-10-CM | POA: Diagnosis not present

## 2017-03-27 DIAGNOSIS — M2560 Stiffness of unspecified joint, not elsewhere classified: Secondary | ICD-10-CM | POA: Diagnosis not present

## 2017-03-27 DIAGNOSIS — M542 Cervicalgia: Secondary | ICD-10-CM | POA: Diagnosis not present

## 2017-04-09 DIAGNOSIS — M2560 Stiffness of unspecified joint, not elsewhere classified: Secondary | ICD-10-CM | POA: Diagnosis not present

## 2017-04-09 DIAGNOSIS — R293 Abnormal posture: Secondary | ICD-10-CM | POA: Diagnosis not present

## 2017-04-09 DIAGNOSIS — M542 Cervicalgia: Secondary | ICD-10-CM | POA: Diagnosis not present

## 2017-07-10 DIAGNOSIS — I10 Essential (primary) hypertension: Secondary | ICD-10-CM | POA: Diagnosis not present

## 2017-07-10 DIAGNOSIS — E7800 Pure hypercholesterolemia, unspecified: Secondary | ICD-10-CM | POA: Diagnosis not present

## 2017-07-10 DIAGNOSIS — D649 Anemia, unspecified: Secondary | ICD-10-CM | POA: Diagnosis not present

## 2017-11-18 DIAGNOSIS — R1110 Vomiting, unspecified: Secondary | ICD-10-CM | POA: Diagnosis not present

## 2017-11-18 DIAGNOSIS — R51 Headache: Secondary | ICD-10-CM | POA: Diagnosis not present

## 2018-01-08 DIAGNOSIS — E7800 Pure hypercholesterolemia, unspecified: Secondary | ICD-10-CM | POA: Diagnosis not present

## 2018-01-08 DIAGNOSIS — R600 Localized edema: Secondary | ICD-10-CM | POA: Diagnosis not present

## 2018-01-08 DIAGNOSIS — I10 Essential (primary) hypertension: Secondary | ICD-10-CM | POA: Diagnosis not present

## 2018-04-13 DIAGNOSIS — Z1231 Encounter for screening mammogram for malignant neoplasm of breast: Secondary | ICD-10-CM | POA: Diagnosis not present

## 2018-04-23 DIAGNOSIS — I359 Nonrheumatic aortic valve disorder, unspecified: Secondary | ICD-10-CM | POA: Diagnosis not present

## 2018-07-05 ENCOUNTER — Encounter: Payer: Self-pay | Admitting: Cardiology

## 2018-07-05 ENCOUNTER — Ambulatory Visit (INDEPENDENT_AMBULATORY_CARE_PROVIDER_SITE_OTHER): Payer: 59 | Admitting: Cardiology

## 2018-07-05 VITALS — BP 136/78 | HR 67 | Ht 66.0 in | Wt 215.0 lb

## 2018-07-05 DIAGNOSIS — I10 Essential (primary) hypertension: Secondary | ICD-10-CM | POA: Diagnosis not present

## 2018-07-05 DIAGNOSIS — Z7189 Other specified counseling: Secondary | ICD-10-CM | POA: Diagnosis not present

## 2018-07-05 DIAGNOSIS — R0602 Shortness of breath: Secondary | ICD-10-CM

## 2018-07-05 DIAGNOSIS — I351 Nonrheumatic aortic (valve) insufficiency: Secondary | ICD-10-CM

## 2018-07-05 NOTE — Progress Notes (Signed)
Cardiology Office Note:    Date:  07/05/2018   ID:  Anner Crete, DOB [birth year], MRN [id number]  PCP:  Patient, No Pcp Per  Cardiologist:  Dr. Derrel Nip to Dr. Jodelle Red, MD PhD  Referring MD: No ref. provider found   CC: shortness of breath  History of Present Illness:    Claudia Barrett is a 56 y.o. female with a hx of hypertension, mild-moderate aortic regurgitation who is seen as a new patient for the evaluation and management of shortness of breath.  Per Dr. York Spaniel note dated 01/06/17, she had recently been hospitalized for substernal chest discomfort. She had a normal lexiscan during that admission. However, she continued to have intermittent discomfort. I do not have additional notes after that visit.   She did have an echo on 04/23/18, with the indication noted as dyspnea and aortic regurgitation. I cannot see the images, but the copy of the report notes normal LV cavity size (EDD 5.0 cm, ESD 3.6 cm), mild concentric LVH, normal wall motion, EF 55%. Mild-moderate AR. Mild MR, trace TR, trace PR.  She reports that she saw Dr. Donnie Aho about a month ago for shortness of breath. She could barely walk to the mailbox without feeling short of breath. She was told that her valve was leaky and didn't close the entire way. She was also told she had fat around her heart, and she has been working on losing weight because of this. She has been walking with her husband and feels short of breath when she walks uphill. Can take about three flights of stairs (5 steps each) before she gets short of breath. Used to be able to do may more flights. Her husband now outwalks her. She can do a mile at a slow pace before her breathing is limited (takes about 30 minutes to walk a mile she thinks). She had the lexiscan done when she was feeling short of breath.   Has been working on weight loss. Peaked at 226 lbs, down to 215 today.  BP runs 110s-130s/70s-80s.Never had trouble until after  her hysterectomy. Notes that her BP went very high with surgery.   No chest pain, PND, orthopnea, LE edema, syncope, palpitations.  Past Medical History:  Diagnosis Date  . HLD (hyperlipidemia)   . Hypertension   . Sciatica     Past Surgical History:  Procedure Laterality Date  . ABDOMINAL HYSTERECTOMY    . BREAST SURGERY      Current Medications: Current Outpatient Medications on File Prior to Visit  Medication Sig  . amLODipine (NORVASC) 10 MG tablet Take 1 tablet (10 mg total) by mouth daily.   No current facility-administered medications on file prior to visit.    Has tried lisinopril in the past and felt poorly.  Takes meloxicam for leg pain, also awaiting PPI prescription.  Allergies:   Codeine and Sulfur   Social History   Socioeconomic History  . Marital status: Married    Spouse name: Not on file  . Number of children: Not on file  . Years of education: Not on file  . Highest education level: Not on file  Occupational History  . Not on file  Social Needs  . Financial resource strain: Not on file  . Food insecurity:    Worry: Not on file    Inability: Not on file  . Transportation needs:    Medical: Not on file    Non-medical: Not on file  Tobacco Use  . Smoking status:  Never Smoker  . Smokeless tobacco: Never Used  Substance and Sexual Activity  . Alcohol use: No  . Drug use: No  . Sexual activity: Not on file  Lifestyle  . Physical activity:    Days per week: Not on file    Minutes per session: Not on file  . Stress: Not on file  Relationships  . Social connections:    Talks on phone: Not on file    Gets together: Not on file    Attends religious service: Not on file    Active member of club or organization: Not on file    Attends meetings of clubs or organizations: Not on file    Relationship status: Not on file  Other Topics Concern  . Not on file  Social History Narrative  . Not on file     Family History: The patient's family  history includes Arrhythmia in her father; Arthritis in her mother; Hypertension in her mother. There is no history of Heart attack.  ROS:   Please see the history of present illness.  Additional pertinent ROS:  Constitutional: Negative for chills, fever, night sweats, unintentional weight loss  HENT: Negative for ear pain and hearing loss.   Eyes: Negative for loss of vision and eye pain.  Respiratory: Negative for cough, sputum, wheezing.   Cardiovascular: Positive for dyspnea on exertion. Negative for chest pain, palpitations , PND, orthopnea, lower extremity edema and claudication.  Gastrointestinal: Negative for abdominal pain, melena, and hematochezia.  Genitourinary: Negative for dysuria and hematuria.  Musculoskeletal: Negative for falls and myalgias.  Skin: Negative for itching and rash.  Neurological: Negative for focal weakness, focal sensory changes and loss of consciousness.  Endo/Heme/Allergies: Does not bruise/bleed easily.    EKGs/Labs/Other Studies Reviewed:    The following studies were reviewed today: Stress 01-02-17 FINDINGS: Perfusion: No decreased activity in the left ventricle on stress imaging to suggest reversible ischemia or infarction.  Wall Motion: Normal left ventricular wall motion. No left ventricular dilation.  Left Ventricular Ejection Fraction: 56 % End diastolic volume 100 ml End systolic volume 44 ml  IMPRESSION: 1. No reversible ischemia or infarction. 2. Normal left ventricular wall motion. 3. Left ventricular ejection fraction 56% 4. Non invasive risk stratification*: Low  echo 04/23/18 I cannot see the images, but the copy of the report notes normal LV cavity size (EDD 5.0 cm, ESD 3.6 cm), mild concentric LVH, normal wall motion, EF 55%. Mild-moderate AR. Mild MR, trace TR, trace PR.  EKG:  EKG is ordered today.  The ekg ordered today demonstrates normal sinus rhythm  Recent Labs: No results found for requested labs within last  8760 hours.  Recent Lipid Panel    Component Value Date/Time   CHOL 180 01/02/2017 0522   TRIG 31 01/02/2017 0522   HDL 70 01/02/2017 0522   CHOLHDL 2.6 01/02/2017 0522   VLDL 6 01/02/2017 0522   LDLCALC 104 (H) 01/02/2017 0522  Updated lipid panel below  Physical Exam:    VS:  BP 136/78 (BP Location: Left Arm, Patient Position: Sitting, Cuff Size: Normal)   Pulse 67   Ht 5\' 6"  (1.676 m)   Wt 215 lb (97.5 kg)   SpO2 97%   BMI 34.70 kg/m     Wt Readings from Last 3 Encounters:  07/05/18 215 lb (97.5 kg)  01/02/17 185 lb 6.5 oz (84.1 kg)   Peaked at 226 lbs.   GEN: Well nourished, well developed in no acute distress HEENT: Normal NECK:  No JVD; No carotid bruits LYMPHATICS: No lymphadenopathy CARDIAC: regular rhythm, normal S1 and S2, no murmurs, rubs, gallops. Radial and DP pulses 2+ bilaterally. RESPIRATORY:  Clear to auscultation without rales, wheezing or rhonchi  ABDOMEN: Soft, non-tender, non-distended MUSCULOSKELETAL:  No edema; No deformity  SKIN: Warm and dry NEUROLOGIC:  Alert and oriented x 3 PSYCHIATRIC:  Normal affect   ASSESSMENT:    1. SOB (shortness of breath) on exertion   2. Essential hypertension   3. Counseling on health promotion and disease prevention   4. Nonrheumatic aortic valve insufficiency    PLAN:    1. Dyspnea on exertion, mild-moderate aortic regurgitation, hypertension: my suspicion is that all of these are intertwined. If her blood pressure elevates with activity, she will have increased afterload and likely worsening aortic regurgitation. It would be helpful to know what her blood pressure does with exercise. While she had a negative lexiscan in the past, she can exercise, so an exercise treadmill test would also evaluate ischemia -exercise treadmill (no diabetes history, no imaging required)  2. Prevention counseling -recommend heart healthy/Mediterranean diet, with whole grains, fruits, vegetable, fish, lean meats, nuts, and olive  oil. Limit salt. -recommend moderate walking, 3-5 times/week for 30-50 minutes each session. Aim for at least 150 minutes.week. Goal should be pace of 3 miles/hours, or walking 1.5 miles in 30 minutes -recommend avoidance of tobacco products. Avoid excess alcohol. -Additional risk factor control:  -Diabetes: A1c is not recently documented  -Lipids: HDL 66, LDL 150, Tchol 229, TG 62.  From 12/2017  -Blood pressure control: 130s/80s, though suspect it may go higher with exercise, as above  -Weight: working on weight loss  Plan for follow up: 3 mos  Medication Adjustments/Labs and Tests Ordered: Current medicines are reviewed at length with the patient today.  Concerns regarding medicines are outlined above.  Orders Placed This Encounter  Procedures  . Exercise Tolerance Test  . EKG 12-Lead   No orders of the defined types were placed in this encounter.   Patient Instructions  Medication Instructions:  Your Physician recommend you continue on your current medication as directed.    If you need a refill on your cardiac medications before your next appointment, please call your pharmacy.   Lab work: None   Testing/Procedures: Your physician has requested that you have an exercise tolerance test. For further information please visit [email or web address] Please also follow instruction sheet, as given. [address]   Follow-Up: At [facility], you and your health needs are our priority.  As part of our continuing mission to provide you with exceptional heart care, we have created designated Provider Care Teams.  These Care Teams include your primary Cardiologist (physician) and Advanced Practice Providers (APPs -  Physician Assistants and Nurse Practitioners) who all work together to provide you with the care you need, when you need it. You will need a follow up appointment in 3 months.  Please call our office 2 months in advance to schedule this appointment.  You may  see Dr. Cristal Deer or one of the following Advanced Practice Providers on your designated Care Team:   Theodore Demark, PA-C . Joni Reining, DNP, ANP  Any Other Special Instructions Will Be Listed Below (If Applicable).       Signed, Jodelle Red, MD PhD 07/05/2018 4:42 PM    [facility]

## 2018-07-05 NOTE — Patient Instructions (Addendum)
Medication Instructions:  Your Physician recommend you continue on your current medication as directed.    If you need a refill on your cardiac medications before your next appointment, please call your pharmacy.   Lab work: None   Testing/Procedures: Your physician has requested that you have an exercise tolerance test. For further information please visit [email or web address] Please also follow instruction sheet, as given. [address]   Follow-Up: At [facility], you and your health needs are our priority.  As part of our continuing mission to provide you with exceptional heart care, we have created designated Provider Care Teams.  These Care Teams include your primary Cardiologist (physician) and Advanced Practice Providers (APPs -  Physician Assistants and Nurse Practitioners) who all work together to provide you with the care you need, when you need it. You will need a follow up appointment in 3 months.  Please call our office 2 months in advance to schedule this appointment.  You may see Dr. Cristal Deer or one of the following Advanced Practice Providers on your designated Care Team:   Theodore Demark, PA-C . Joni Reining, DNP, ANP  Any Other Special Instructions Will Be Listed Below (If Applicable).

## 2018-07-07 ENCOUNTER — Telehealth (HOSPITAL_COMMUNITY): Payer: Self-pay

## 2018-07-07 NOTE — Telephone Encounter (Signed)
Encounter complete. 

## 2018-07-09 ENCOUNTER — Ambulatory Visit (HOSPITAL_COMMUNITY): Admission: RE | Admit: 2018-07-09 | Payer: 59 | Source: Ambulatory Visit | Attending: Cardiology | Admitting: Cardiology

## 2018-07-20 ENCOUNTER — Telehealth (HOSPITAL_COMMUNITY): Payer: Self-pay

## 2018-07-20 NOTE — Telephone Encounter (Signed)
Encounter complete. 

## 2018-07-21 ENCOUNTER — Ambulatory Visit (HOSPITAL_COMMUNITY): Admission: RE | Admit: 2018-07-21 | Payer: 59 | Source: Ambulatory Visit | Attending: Cardiology | Admitting: Cardiology

## 2018-07-26 ENCOUNTER — Encounter (HOSPITAL_COMMUNITY): Payer: Self-pay | Admitting: Cardiology

## 2018-08-04 ENCOUNTER — Telehealth (HOSPITAL_COMMUNITY): Payer: Self-pay

## 2018-08-04 NOTE — Telephone Encounter (Signed)
Encounter complete. 

## 2018-08-05 ENCOUNTER — Telehealth (HOSPITAL_COMMUNITY): Payer: Self-pay

## 2018-08-05 NOTE — Telephone Encounter (Signed)
Encounter complete. 

## 2018-08-06 ENCOUNTER — Ambulatory Visit (HOSPITAL_COMMUNITY)
Admission: RE | Admit: 2018-08-06 | Discharge: 2018-08-06 | Disposition: A | Discharge status: Home / Self Care | Payer: 59 | Source: Ambulatory Visit | Attending: Cardiology | Admitting: Cardiology

## 2018-08-06 DIAGNOSIS — R0602 Shortness of breath: Secondary | ICD-10-CM | POA: Diagnosis not present

## 2018-08-07 LAB — EXERCISE TOLERANCE TEST
CHL RATE OF PERCEIVED EXERTION: 18
CSEPED: 6 min
CSEPEDS: 0 s
CSEPEW: 7 METS
MPHR: 164 {beats}/min
Peak HR: 141 {beats}/min
Percent HR: 86 %
Rest HR: 74 {beats}/min

## 2018-09-21 DIAGNOSIS — J111 Influenza due to unidentified influenza virus with other respiratory manifestations: Secondary | ICD-10-CM | POA: Diagnosis not present

## 2018-10-06 ENCOUNTER — Ambulatory Visit: Payer: 59 | Admitting: Cardiology

## 2018-10-07 DIAGNOSIS — R0789 Other chest pain: Secondary | ICD-10-CM | POA: Diagnosis not present

## 2018-10-07 DIAGNOSIS — R0609 Other forms of dyspnea: Secondary | ICD-10-CM | POA: Diagnosis not present

## 2018-10-07 DIAGNOSIS — J4521 Mild intermittent asthma with (acute) exacerbation: Secondary | ICD-10-CM | POA: Diagnosis not present

## 2019-01-10 DIAGNOSIS — I10 Essential (primary) hypertension: Secondary | ICD-10-CM | POA: Diagnosis not present

## 2019-01-10 DIAGNOSIS — E669 Obesity, unspecified: Secondary | ICD-10-CM | POA: Diagnosis not present

## 2019-07-25 ENCOUNTER — Other Ambulatory Visit: Payer: Self-pay

## 2019-07-25 DIAGNOSIS — Z20822 Contact with and (suspected) exposure to covid-19: Secondary | ICD-10-CM

## 2019-07-26 LAB — NOVEL CORONAVIRUS, NAA: SARS-CoV-2, NAA: DETECTED — AB

## 2020-04-03 ENCOUNTER — Other Ambulatory Visit: Payer: Self-pay | Admitting: Family Medicine

## 2020-04-03 ENCOUNTER — Other Ambulatory Visit: Payer: Self-pay

## 2020-04-03 ENCOUNTER — Ambulatory Visit
Admission: RE | Admit: 2020-04-03 | Discharge: 2020-04-03 | Disposition: A | Discharge status: Home / Self Care | Payer: 59 | Source: Ambulatory Visit | Attending: Family Medicine | Admitting: Family Medicine

## 2020-04-03 DIAGNOSIS — M25512 Pain in left shoulder: Secondary | ICD-10-CM

## 2020-04-03 DIAGNOSIS — M542 Cervicalgia: Secondary | ICD-10-CM

## 2020-08-29 ENCOUNTER — Other Ambulatory Visit: Payer: Self-pay

## 2020-08-29 ENCOUNTER — Encounter (INDEPENDENT_AMBULATORY_CARE_PROVIDER_SITE_OTHER): Payer: Self-pay | Admitting: Otolaryngology

## 2020-08-29 ENCOUNTER — Ambulatory Visit (INDEPENDENT_AMBULATORY_CARE_PROVIDER_SITE_OTHER): Payer: 59 | Admitting: Otolaryngology

## 2020-08-29 VITALS — Temp 95.0°F

## 2020-08-29 DIAGNOSIS — T161XXA Foreign body in right ear, initial encounter: Secondary | ICD-10-CM | POA: Diagnosis not present

## 2020-08-29 NOTE — Progress Notes (Signed)
HPI: Claudia Barrett is a 59 y.o. female who presents is referred by hearing life for evaluation of a foreign body or dome of a hearing aid stuck in her right ear canal.  She has had hearing aids for several years.  She has congenital hearing loss..  Past Medical History:  Diagnosis Date  . HLD (hyperlipidemia)   . Hypertension   . Sciatica    Past Surgical History:  Procedure Laterality Date  . ABDOMINAL HYSTERECTOMY    . BREAST SURGERY     Social History   Socioeconomic History  . Marital status: Married    Spouse name: Not on file  . Number of children: Not on file  . Years of education: Not on file  . Highest education level: Not on file  Occupational History  . Not on file  Tobacco Use  . Smoking status: Never Smoker  . Smokeless tobacco: Never Used  Substance and Sexual Activity  . Alcohol use: No  . Drug use: No  . Sexual activity: Not on file  Other Topics Concern  . Not on file  Social History Narrative  . Not on file   Social Determinants of Health   Financial Resource Strain: Not on file  Food Insecurity: Not on file  Transportation Needs: Not on file  Physical Activity: Not on file  Stress: Not on file  Social Connections: Not on file   Family History  Problem Relation Age of Onset  . Hypertension Mother   . Arthritis Mother   . Arrhythmia Father        needed pacemaker  . Heart attack Neg Hx    Allergies  Allergen Reactions  . Codeine Other (See Comments)    Extreme fatigue  . Sulfur Nausea And Vomiting   Prior to Admission medications   Medication Sig Start Date End Date Taking? Authorizing Provider  amLODipine (NORVASC) 10 MG tablet Take 1 tablet (10 mg total) by mouth daily. 01/02/17  Yes Leroy Sea, MD     Positive ROS: Otherwise negative  All other systems have been reviewed and were otherwise negative with the exception of those mentioned in the HPI and as above.  Physical Exam: Constitutional: Alert, well-appearing, no  acute distress Ears: External ears without lesions or tenderness.  Left ear canal and left TM are clear.  Right ear canal reveals a dome in the right ear canal adjacent to the TM.  This was removed with forceps.  She had minimal wax buildup.  The TM was otherwise clear. Nasal: External nose without lesions.. Clear nasal passages Oral: Lips and gums without lesions. Tongue and palate mucosa without lesions. Posterior oropharynx clear. Neck: No palpable adenopathy or masses Respiratory: Breathing comfortably  Skin: No facial/neck lesions or rash noted.  Removal of ear foreign body  Date/Time: 08/29/2020 10:30 AM Performed by: Drema Halon, MD Authorized by: Drema Halon, MD   Consent:    Consent obtained:  Verbal   Consent given by:  Patient Location:    Location:  Ear Procedure details:    Localization method:  Microscope   Removal mechanism:  Alligator forceps Post-procedure details:    Confirmation:  No additional foreign bodies on visualization   Patient tolerance of procedure:  Tolerated with difficulty Comments:     Patient with a dome of the hearing aid stuck in the right ear canal that was removed in the office today.  Ear canal and TM otherwise clear.    Assessment: Foreign body in  the right ear canal.  Plan: This was removed in the office.  She will follow-up as needed.   Narda Bonds, MD   CC:

## 2021-03-28 IMAGING — CR DG CERVICAL SPINE 2 OR 3 VIEWS
3 series · 3 of 3 positions shown · non-contrast
Comparison: None.

CLINICAL DATA: Neck pain and left shoulder pain for 3 weeks

EXAM:
CERVICAL SPINE - 2-3 VIEW

[w cervical spine lat]
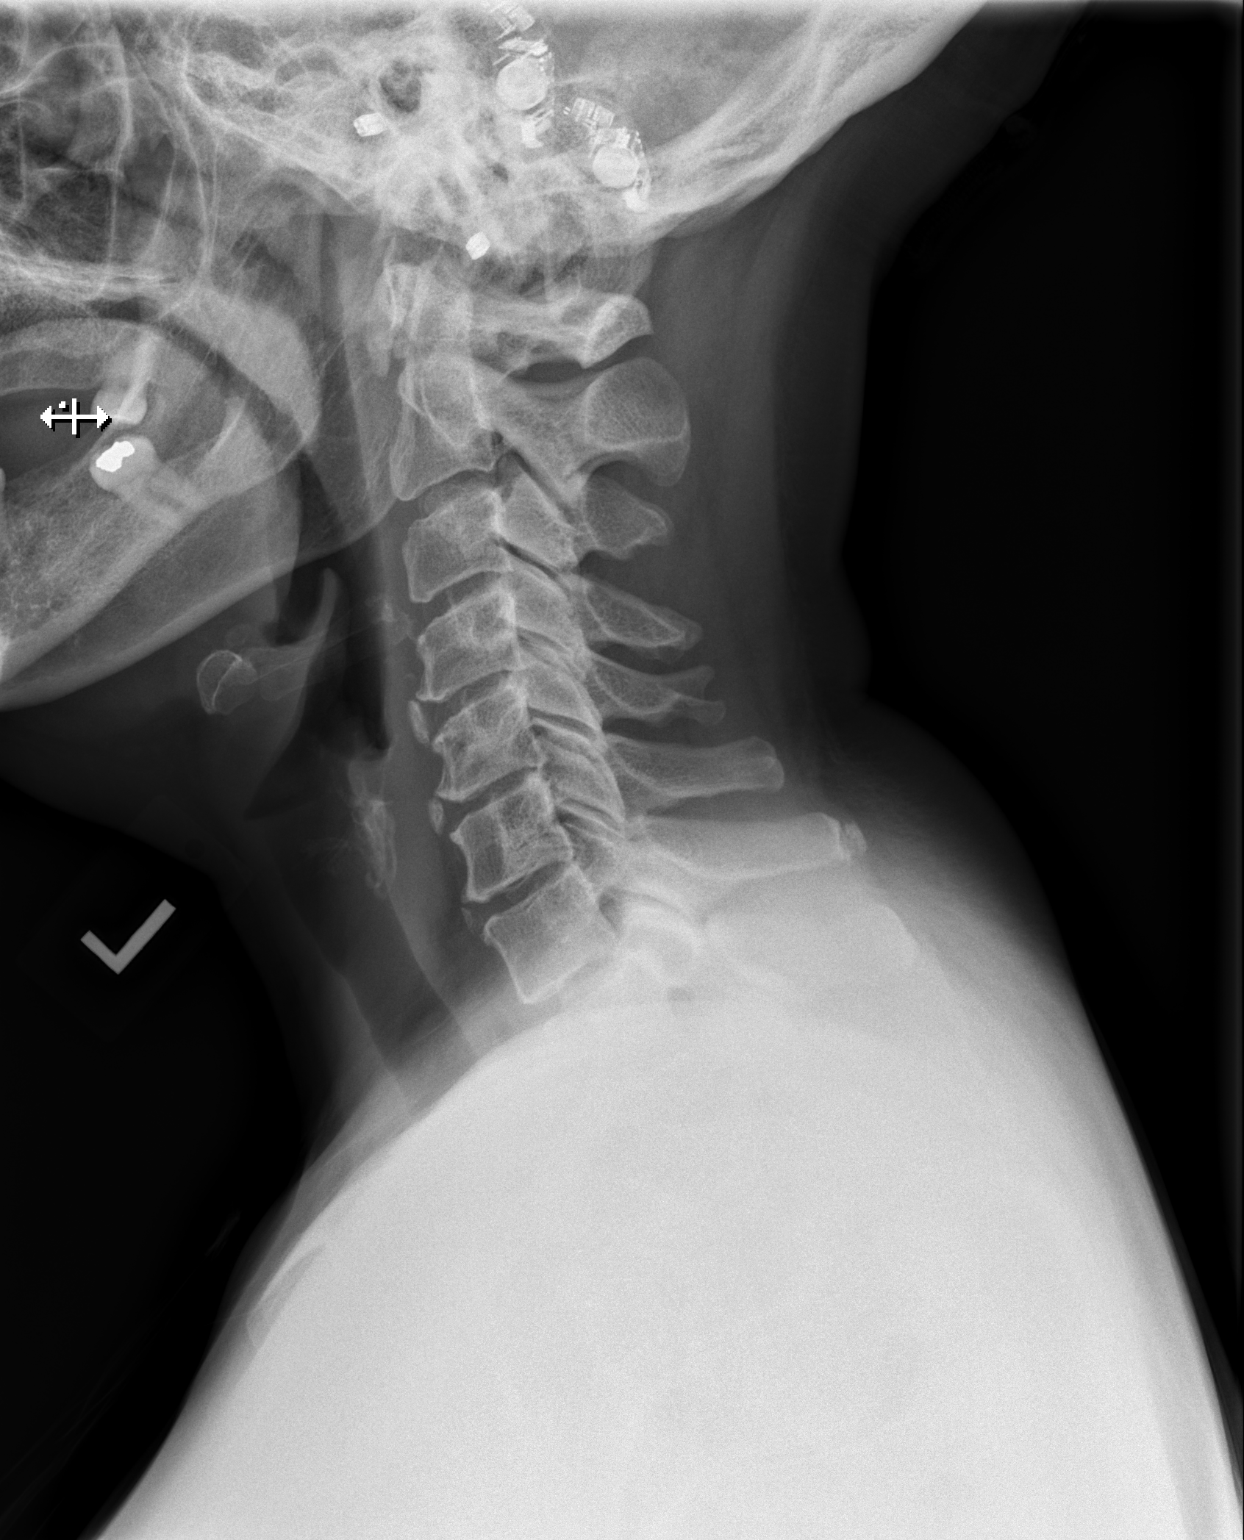

[w cervical spine ap]
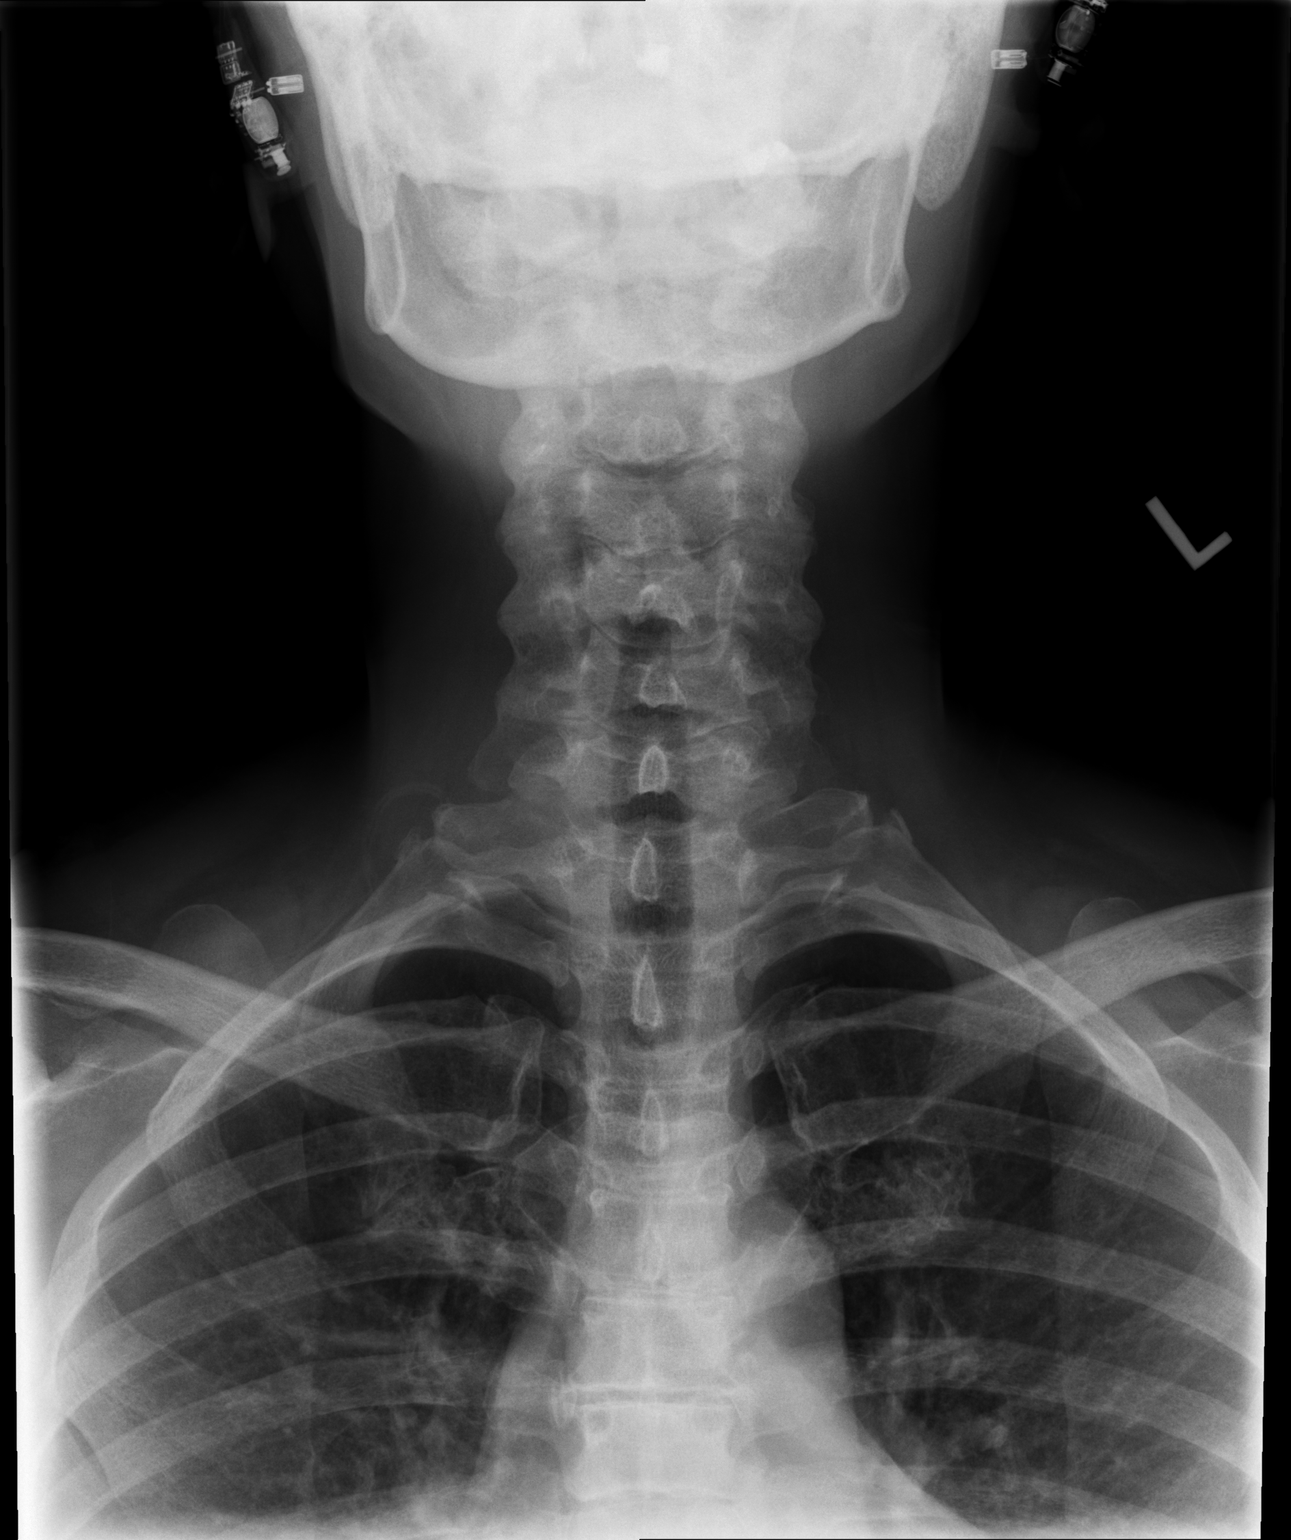

[w cervical spine odontoid]
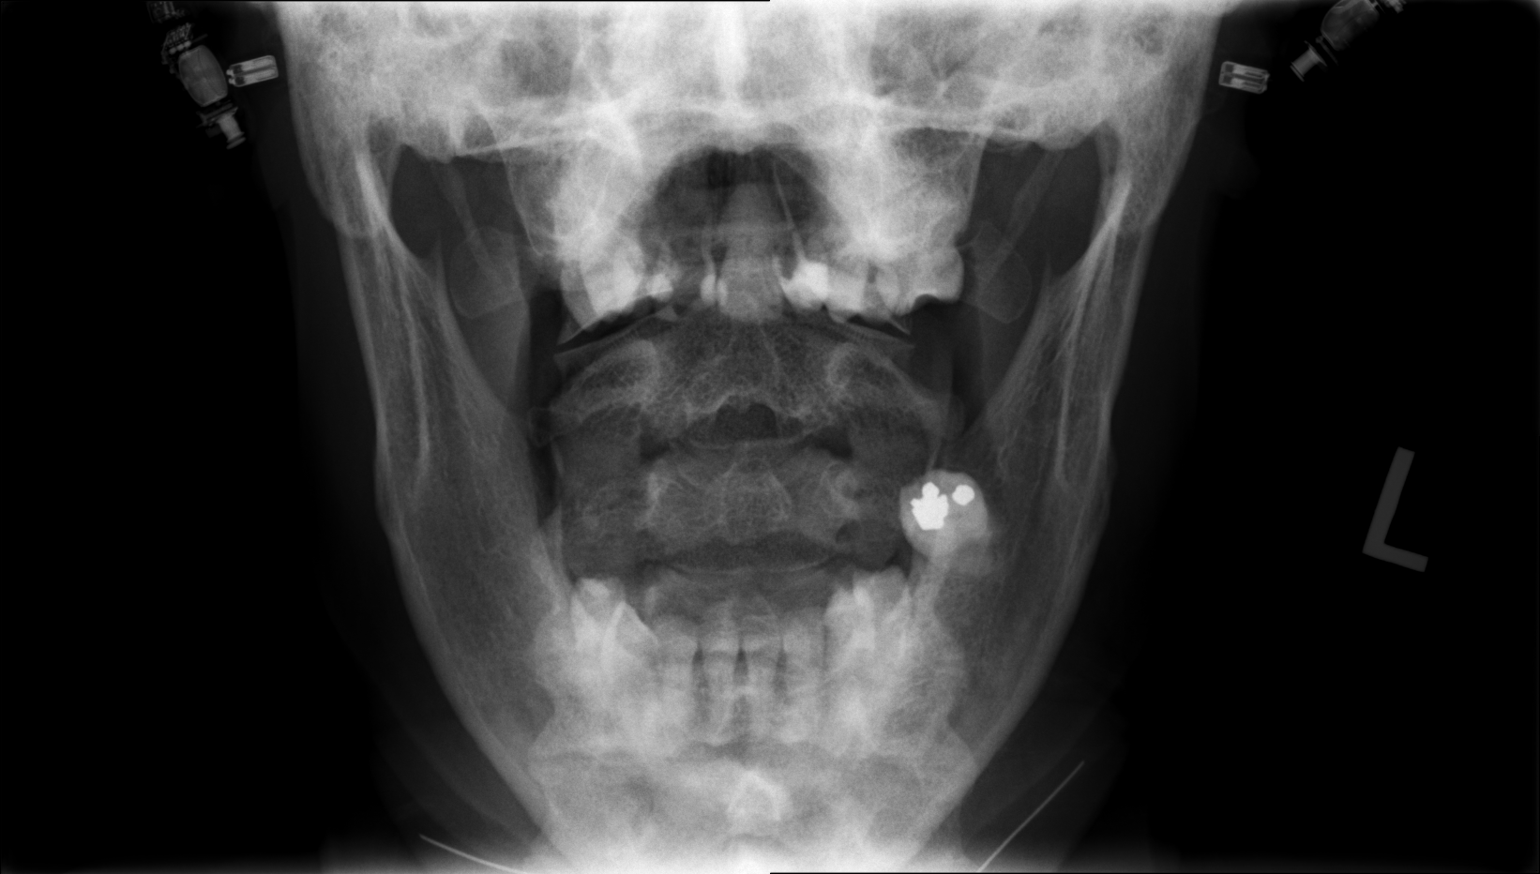

[3 of 3 positions shown; findings below may reference images not displayed]

FINDINGS: Frontal and lateral views of the cervical spine are obtained
alignment is anatomic to the cervicothoracic junction. Mild
spondylosis at C4-5, C5-6, and C6-7. No acute displaced fractures.
Soft tissues are unremarkable. Lung apices are clear.
IMPRESSION: 1. Mild lower cervical spondylosis.  No acute bony abnormality.  CT

## 2021-04-18 ENCOUNTER — Other Ambulatory Visit: Payer: Self-pay

## 2021-04-18 ENCOUNTER — Encounter: Payer: Self-pay | Admitting: Plastic Surgery

## 2021-04-18 ENCOUNTER — Ambulatory Visit: Payer: 59 | Admitting: Plastic Surgery

## 2021-04-18 VITALS — BP 167/99 | HR 70 | Ht 66.0 in | Wt 227.0 lb

## 2021-04-18 DIAGNOSIS — M545 Low back pain, unspecified: Secondary | ICD-10-CM

## 2021-04-18 DIAGNOSIS — M546 Pain in thoracic spine: Secondary | ICD-10-CM

## 2021-04-18 DIAGNOSIS — N62 Hypertrophy of breast: Secondary | ICD-10-CM

## 2021-04-18 DIAGNOSIS — M4004 Postural kyphosis, thoracic region: Secondary | ICD-10-CM | POA: Diagnosis not present

## 2021-04-18 NOTE — Progress Notes (Signed)
Referring Provider Johny Blamer, MD [phone] [place],  [place] [address]   CC:  Chief Complaint  Patient presents with   consult      Claudia Barrett is an 60 y.o. female.  HPI: Patient presents to discuss breast reduction.  She has had years of back pain, neck pain and shoulder grooving related to her large breast.  She tried over-the-counter medications, warm packs, cold packs and supportive bras with little relief.  She also gets rashes beneath her breast that been refractory to over-the-counter and prescription treatments.  She is currently a triple D cup and wants to be a B or C cup.  She had a maternal aunt with breast cancer and has had 2 benign cyst removed on her in the distant past.  She does not smoke and is nondiabetic.  She has been to the chiropractor with little relief of her symptoms.  She is due for a mammogram but has not had any suspicious cancerous findings in the past.  Allergies  Allergen Reactions   Codeine Other (See Comments)    Extreme fatigue   Elemental Sulfur Nausea And Vomiting    Outpatient Encounter Medications as of 04/18/2021  Medication Sig   amLODipine (NORVASC) 10 MG tablet Take 1 tablet (10 mg total) by mouth daily.   No facility-administered encounter medications on file as of 04/18/2021.     Past Medical History:  Diagnosis Date   HLD (hyperlipidemia)    Hypertension    Sciatica     Past Surgical History:  Procedure Laterality Date   ABDOMINAL HYSTERECTOMY     BREAST SURGERY      Family History  Problem Relation Age of Onset   Hypertension Mother    Arthritis Mother    Arrhythmia Father        needed pacemaker   Heart attack Neg Hx     Social History   Social History Narrative   Not on file     Review of Systems General: Denies fevers, chills, weight loss CV: Denies chest pain, shortness of breath, palpitations  Physical Exam Vitals with BMI 04/18/2021 07/05/2018 01/02/2017  Height 5\' 6"  5\' 6"  -   Weight 227 lbs 215 lbs -  BMI 36.66 34.72 -  Systolic 167 136 -  Diastolic 99 78 -  Pulse 70 67 66    General:  No acute distress,  Alert and oriented, Non-Toxic, Normal speech and affect Breast: She has grade 3 ptosis.  Sternal notch to nipple is 30 cm in the right 32 cm on the left.  Nipple to fold is 17 cm bilaterally.  No obvious scars or masses.  Assessment/Plan The patient has bilateral symptomatic macromastia.  She is a good candidate for a breast reduction.  She is interested in pursuing surgical treatment.  She has tried supportive garments and fitted bras with no relief.  The details of breast reduction surgery were discussed.  I explained the procedure in detail along the with the expected scars.  The risks were discussed in detail and include bleeding, infection, damage to surrounding structures, need for additional procedures, nipple loss, change in nipple sensation, persistent pain, contour irregularities and asymmetries.  I explained that breast feeding is often not possible after breast reduction surgery.  We discussed the expected postoperative course with an overall recovery period of about 1 month.  She demonstrated full understanding of all risks.  We discussed her personal risk factors.  The patient is interested in  pursuing surgical treatment.  I anticipate approximately 890g of tissue removed from each side.   Allena Napoleon 04/18/2021, 5:10 PM

## 2021-05-27 NOTE — Progress Notes (Signed)
Patient ID: Claudia Barrett, female    DOB: 1960-09-17, 60 y.o.   MRN: [id number]  Chief Complaint  Patient presents with   Pre-op Exam      ICD-10-CM   1. Macromastia  N62        History of Present Illness: Claudia Barrett is a 60 y.o.  female  with a history of macromastia.  She presents for preoperative evaluation for upcoming procedure, bilateral breast reduction, scheduled for 06/18/2021 with Dr. Arita Miss.  Patient denies any significant complication from anesthesia.  However, her daughter reportedly told her that she was "slow to come out".  Summary of Previous Visit: Patient was seen here in clinic for initial consult 04/18/2021.  At that time, she noted inframammary rashes as Barrett as neck, shoulder, and upper back discomfort in context of large breasts.  She is currently a DDD cup and would like to be a B or C cup.  She has had multiple benign cysts removed from her breasts.  Family history of breast cancer.  She has sought treatment from chiropractor for her upper back and neck discomfort without improvement.  Anticipate 890 g of tissue removal from each side.  STN 32 cm left side, 30 cm right side.  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  However, she feels that she can go back to her job full-time after 2 weeks with restrictions as she can perform desk duties.  PMH Significant for: Macromastia, HTN  She reiterates that she would like to be a B or C cup.   Past Medical History: Allergies: Allergies  Allergen Reactions   Codeine Other (See Comments)    Extreme fatigue   Elemental Sulfur Nausea And Vomiting    Current Medications:  Current Outpatient Medications:    amLODipine (NORVASC) 10 MG tablet, Take 1 tablet (10 mg total) by mouth daily., Disp: 30 tablet, Rfl: 0   cephALEXin (KEFLEX) 500 MG capsule, Take 1 capsule (500 mg total) by mouth 4 (four) times daily for 3 days., Disp: 12  capsule, Rfl: 0   HYDROcodone-acetaminophen (NORCO) 5-325 MG tablet, Take 1 tablet by mouth every 6 (six) hours as needed for up to 5 days for severe pain., Disp: 20 tablet, Rfl: 0   Naproxen Sodium (ALEVE PO), Take by mouth., Disp: , Rfl:    ondansetron (ZOFRAN ODT) 4 MG disintegrating tablet, Take 1 tablet (4 mg total) by mouth every 8 (eight) hours as needed for nausea or vomiting., Disp: 20 tablet, Rfl: 0   pantoprazole (PROTONIX) 40 MG tablet, Take 40 mg by mouth daily., Disp: , Rfl:   Past Medical Problems: Past Medical History:  Diagnosis Date   HLD (hyperlipidemia)    Hypertension    Sciatica     Past Surgical History: Past Surgical History:  Procedure Laterality Date   ABDOMINAL HYSTERECTOMY     BREAST SURGERY      Social History: Social History   Socioeconomic History   Marital status: Married    Spouse name: Not on file   Number of children: Not on file   Years of education: Not on file   Highest education level: Not on file  Occupational History   Not on file  Tobacco Use   Smoking status: Never   Smokeless tobacco: Never  Substance and Sexual Activity   Alcohol use: No   Drug use: No   Sexual activity: Not on file  Other Topics Concern   Not on file  Social History Narrative   Not on file   Social Determinants of Health   Financial Resource Strain: Not on file  Food Insecurity: Not on file  Transportation Needs: Not on file  Physical Activity: Not on file  Stress: Not on file  Social Connections: Not on file  Intimate Partner Violence: Not on file    Family History: Family History  Problem Relation Age of Onset   Hypertension Mother    Arthritis Mother    Arrhythmia Father        needed pacemaker   Heart attack Neg Hx     Review of Systems: Review of Systems  Constitutional:  Negative for fever.   Physical Exam: Vital Signs BP (!) 147/81 (BP Location: Right Arm, Patient Position: Sitting, Cuff Size: Large)   Pulse 74   Ht 5\' 6"   (1.676 m)   Wt 227 lb (103 kg)   SpO2 100%   BMI 36.64 kg/m   Physical Exam  Constitutional:      General: Not in acute distress.    Appearance: Normal appearance. Not ill-appearing.  HENT:     Head: Normocephalic and atraumatic.  Eyes:     Pupils: Pupils are equal, round Neck:     Musculoskeletal: Normal range of motion.  Cardiovascular:     Rate and Rhythm: Normal rate    Pulses: Normal pulses.  Pulmonary:     Effort: Pulmonary effort is normal. No respiratory distress.  Abdominal:     General: Abdomen is flat. There is no distension.  Musculoskeletal: Normal range of motion.  No lower extremity swelling or edema.  Peripheral pulses intact and symmetric.  No varicosities noted. Skin:    General: Skin is warm and dry.     Findings: No erythema or rash.  Neurological:     General: No focal deficit present.     Mental Status: Alert and oriented to person, place, and time. Mental status is at baseline.     Motor: No weakness.  Psychiatric:        Mood and Affect: Mood normal.        Behavior: Behavior normal.    Assessment/Plan: The patient is scheduled for bilateral breast reduction scheduled for 06/18/2021 with Dr. 08/18/2021.  Risks, benefits, and alternatives of procedure discussed, questions answered and consent obtained.    Smoking Status: None. Last Mammogram: 04/22/2021; Results: Negative, BI-RADS Category 2.  Caprini Score: 4; Risk Factors include: Age, BMI greater than 25, and length of planned surgery. Recommendation for mechanical. Encourage early ambulation.   Pictures obtained: At initial consult, 04/18/2021  Post-op Rx sent to pharmacy: Norco, Keflex, Zofran.  While codeine allergy is listed, she believes she can take the Norco.   Patient was provided with the General Surgical Risk consent document and Pain Medication Agreement prior to their appointment.  They had adequate time to read through the risk consent documents and Pain Medication Agreement. We also  discussed them in person together during this preop appointment. All of their questions were answered to their satisfaction.  Recommended calling if they have any further questions.  Risk consent form and Pain Medication Agreement to be scanned into patient's chart.  The risk that can be encountered with breast reduction were discussed and include the following but not limited to these:  Breast asymmetry, fluid accumulation, firmness of the breast, inability to breast feed, loss of nipple or areola, skin loss, decrease or no nipple sensation, fat  necrosis of the breast tissue, bleeding, infection, healing delay.  There are risks of anesthesia, changes to skin sensation and injury to nerves or blood vessels.  The muscle can be temporarily or permanently injured.  You may have an allergic reaction to tape, suture, glue, blood products which can result in skin discoloration, swelling, pain, skin lesions, poor healing.  Any of these can lead to the need for revisonal surgery or stage procedures.  A reduction has potential to interfere with diagnostic procedures.  Nipple or breast piercing can increase risks of infection.  This procedure is best done when the breast is fully developed.  Changes in the breast will continue to occur over time.  Pregnancy can alter the outcomes of previous breast reduction surgery, weight gain and weigh loss can also effect the long term appearance.    Electronically signed by: Evelena Leyden, PA-C 05/28/2021 2:01 PM

## 2021-05-27 NOTE — H&P (View-Only) (Signed)
Patient ID: Claudia Barrett, female    DOB: 1960-09-17, 60 y.o.   MRN: [id number]  Chief Complaint  Patient presents with   Pre-op Exam      ICD-10-CM   1. Macromastia  N62        History of Present Illness: Claudia Barrett is a 60 y.o.  female  with a history of macromastia.  She presents for preoperative evaluation for upcoming procedure, bilateral breast reduction, scheduled for 06/18/2021 with Dr. Arita Miss.  Patient denies any significant complication from anesthesia.  However, her daughter reportedly told her that she was "slow to come out".  Summary of Previous Visit: Patient was seen here in clinic for initial consult 04/18/2021.  At that time, she noted inframammary rashes as Barrett as neck, shoulder, and upper back discomfort in context of large breasts.  She is currently a DDD cup and would like to be a B or C cup.  She has had multiple benign cysts removed from her breasts.  Family history of breast cancer.  She has sought treatment from chiropractor for her upper back and neck discomfort without improvement.  Anticipate 890 g of tissue removal from each side.  STN 32 cm left side, 30 cm right side.  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  However, she feels that she can go back to her job full-time after 2 weeks with restrictions as she can perform desk duties.  PMH Significant for: Macromastia, HTN  She reiterates that she would like to be a B or C cup.   Past Medical History: Allergies: Allergies  Allergen Reactions   Codeine Other (See Comments)    Extreme fatigue   Elemental Sulfur Nausea And Vomiting    Current Medications:  Current Outpatient Medications:    amLODipine (NORVASC) 10 MG tablet, Take 1 tablet (10 mg total) by mouth daily., Disp: 30 tablet, Rfl: 0   cephALEXin (KEFLEX) 500 MG capsule, Take 1 capsule (500 mg total) by mouth 4 (four) times daily for 3 days., Disp: 12  capsule, Rfl: 0   HYDROcodone-acetaminophen (NORCO) 5-325 MG tablet, Take 1 tablet by mouth every 6 (six) hours as needed for up to 5 days for severe pain., Disp: 20 tablet, Rfl: 0   Naproxen Sodium (ALEVE PO), Take by mouth., Disp: , Rfl:    ondansetron (ZOFRAN ODT) 4 MG disintegrating tablet, Take 1 tablet (4 mg total) by mouth every 8 (eight) hours as needed for nausea or vomiting., Disp: 20 tablet, Rfl: 0   pantoprazole (PROTONIX) 40 MG tablet, Take 40 mg by mouth daily., Disp: , Rfl:   Past Medical Problems: Past Medical History:  Diagnosis Date   HLD (hyperlipidemia)    Hypertension    Sciatica     Past Surgical History: Past Surgical History:  Procedure Laterality Date   ABDOMINAL HYSTERECTOMY     BREAST SURGERY      Social History: Social History   Socioeconomic History   Marital status: Married    Spouse name: Not on file   Number of children: Not on file   Years of education: Not on file   Highest education level: Not on file  Occupational History   Not on file  Tobacco Use   Smoking status: Never   Smokeless tobacco: Never  Substance and Sexual Activity   Alcohol use: No   Drug use: No   Sexual activity: Not on file  Other Topics Concern   Not on file  Social History Narrative   Not on file   Social Determinants of Health   Financial Resource Strain: Not on file  Food Insecurity: Not on file  Transportation Needs: Not on file  Physical Activity: Not on file  Stress: Not on file  Social Connections: Not on file  Intimate Partner Violence: Not on file    Family History: Family History  Problem Relation Age of Onset   Hypertension Mother    Arthritis Mother    Arrhythmia Father        needed pacemaker   Heart attack Neg Hx     Review of Systems: Review of Systems  Constitutional:  Negative for fever.   Physical Exam: Vital Signs BP (!) 147/81 (BP Location: Right Arm, Patient Position: Sitting, Cuff Size: Large)   Pulse 74   Ht 5\' 6"   (1.676 m)   Wt 227 lb (103 kg)   SpO2 100%   BMI 36.64 kg/m   Physical Exam  Constitutional:      General: Not in acute distress.    Appearance: Normal appearance. Not ill-appearing.  HENT:     Head: Normocephalic and atraumatic.  Eyes:     Pupils: Pupils are equal, round Neck:     Musculoskeletal: Normal range of motion.  Cardiovascular:     Rate and Rhythm: Normal rate    Pulses: Normal pulses.  Pulmonary:     Effort: Pulmonary effort is normal. No respiratory distress.  Abdominal:     General: Abdomen is flat. There is no distension.  Musculoskeletal: Normal range of motion.  No lower extremity swelling or edema.  Peripheral pulses intact and symmetric.  No varicosities noted. Skin:    General: Skin is warm and dry.     Findings: No erythema or rash.  Neurological:     General: No focal deficit present.     Mental Status: Alert and oriented to person, place, and time. Mental status is at baseline.     Motor: No weakness.  Psychiatric:        Mood and Affect: Mood normal.        Behavior: Behavior normal.    Assessment/Plan: The patient is scheduled for bilateral breast reduction scheduled for 06/18/2021 with Dr. 08/18/2021.  Risks, benefits, and alternatives of procedure discussed, questions answered and consent obtained.    Smoking Status: None. Last Mammogram: 04/22/2021; Results: Negative, BI-RADS Category 2.  Caprini Score: 4; Risk Factors include: Age, BMI greater than 25, and length of planned surgery. Recommendation for mechanical. Encourage early ambulation.   Pictures obtained: At initial consult, 04/18/2021  Post-op Rx sent to pharmacy: Norco, Keflex, Zofran.  While codeine allergy is listed, she believes she can take the Norco.   Patient was provided with the General Surgical Risk consent document and Pain Medication Agreement prior to their appointment.  They had adequate time to read through the risk consent documents and Pain Medication Agreement. We also  discussed them in person together during this preop appointment. All of their questions were answered to their satisfaction.  Recommended calling if they have any further questions.  Risk consent form and Pain Medication Agreement to be scanned into patient's chart.  The risk that can be encountered with breast reduction were discussed and include the following but not limited to these:  Breast asymmetry, fluid accumulation, firmness of the breast, inability to breast feed, loss of nipple or areola, skin loss, decrease or no nipple sensation, fat  necrosis of the breast tissue, bleeding, infection, healing delay.  There are risks of anesthesia, changes to skin sensation and injury to nerves or blood vessels.  The muscle can be temporarily or permanently injured.  You may have an allergic reaction to tape, suture, glue, blood products which can result in skin discoloration, swelling, pain, skin lesions, poor healing.  Any of these can lead to the need for revisonal surgery or stage procedures.  A reduction has potential to interfere with diagnostic procedures.  Nipple or breast piercing can increase risks of infection.  This procedure is best done when the breast is fully developed.  Changes in the breast will continue to occur over time.  Pregnancy can alter the outcomes of previous breast reduction surgery, weight gain and weigh loss can also effect the long term appearance.    Electronically signed by: Evelena Leyden, PA-C 05/28/2021 2:01 PM

## 2021-05-28 ENCOUNTER — Other Ambulatory Visit: Payer: Self-pay

## 2021-05-28 ENCOUNTER — Ambulatory Visit: Payer: 59 | Admitting: Physician Assistant

## 2021-05-28 VITALS — BP 147/81 | HR 74 | Ht 66.0 in | Wt 227.0 lb

## 2021-05-28 DIAGNOSIS — N62 Hypertrophy of breast: Secondary | ICD-10-CM

## 2021-05-28 MED ORDER — HYDROCODONE-ACETAMINOPHEN 5-325 MG PO TABS: 1.0000 | ORAL_TABLET | Freq: Four times a day (QID) | ORAL | 0 refills | Status: AC | PRN

## 2021-05-28 MED ORDER — ONDANSETRON 4 MG PO TBDP: 4.0000 mg | ORAL_TABLET | Freq: Three times a day (TID) | ORAL | 0 refills | Status: DC | PRN

## 2021-05-28 MED ORDER — CEPHALEXIN 500 MG PO CAPS: 500.0000 mg | ORAL_CAPSULE | Freq: Four times a day (QID) | ORAL | 0 refills | Status: AC

## 2021-05-29 ENCOUNTER — Encounter: Payer: 59 | Admitting: Surgical

## 2021-06-10 ENCOUNTER — Other Ambulatory Visit: Payer: Self-pay

## 2021-06-10 ENCOUNTER — Telehealth: Payer: Self-pay | Admitting: *Deleted

## 2021-06-10 ENCOUNTER — Encounter (HOSPITAL_BASED_OUTPATIENT_CLINIC_OR_DEPARTMENT_OTHER): Payer: Self-pay | Admitting: Plastic Surgery

## 2021-06-10 NOTE — Telephone Encounter (Signed)
   [facility] Pre-operative Risk Assessment    Patient Name: Claudia Barrett  DOB: 23-Mar-1961 MRN: [id number]  HEARTCARE STAFF:  - IMPORTANT!!!!!! Under Visit Info/Reason for Call, type in Other and utilize the format Clearance MM/DD/YY or Clearance TBD. Do not use dashes or single digits. - Please review there is not already an duplicate clearance open for this procedure. - If request is for dental extraction, please clarify the # of teeth to be extracted. - If the patient is currently at the dentist's office, call Pre-Op Callback Staff (MA/nurse) to input urgent request.  - If the patient is not currently in the dentist office, please route to the Pre-Op pool.  Request for surgical clearance:  What type of surgery is being performed? B/L BREAST REDUCTION  When is this surgery scheduled? 06/18/21  What type of clearance is required (medical clearance vs. Pharmacy clearance to hold med vs. Both)? MEDICAL  Are there any medications that need to be held prior to surgery and how long?  NONE LISTED  Practice name and name of physician performing surgery? [facility]; DR. Silverio Lay [facility]  What is the office phone number? [phone]   7.   What is the office fax number? [phone]  8.   Anesthesia type (None, local, MAC, general) ? GENERAL   Julaine Hua 06/10/2021, 6:03 PM  _________________________________________________________________   (provider comments below)

## 2021-06-10 NOTE — Progress Notes (Signed)
Left message with Judeth Cornfield at Dr. Thomos Lemons office to notify that cardiac clearance is needed prior to sx.

## 2021-06-11 NOTE — Telephone Encounter (Signed)
Patient has been rescheduled for 10/3 with Gillian Shields at 8:30 am. Appointment notes have been updated.

## 2021-06-11 NOTE — Telephone Encounter (Signed)
   Name: CJ BEECHER  DOB: 09-27-1960  MRN: [id number]  Primary Cardiologist: None  Chart reviewed as part of pre-operative protocol coverage. Because of Aysha Livecchi Linney's past medical history and time since last visit, she will require a follow-up visit in order to better assess preoperative cardiovascular risk.  This patient has an appointment 07/11/21 with Gillian Shields however her surgery is scheduled for 06/18/21. Is there any way to move this appointment sooner so that we can avoid postponing her surgery?   Pre-op covering staff: - Please schedule appointment and call patient to inform them. If patient already had an upcoming appointment within acceptable timeframe, please add "pre-op clearance" to the appointment notes so provider is aware.   If applicable, this message will also be routed to pharmacy pool and/or primary cardiologist for input on holding anticoagulant/antiplatelet agent as requested below so that this information is available to the clearing provider at time of patient's appointment.   Georgie Chard, NP  06/11/2021, 8:12 AM

## 2021-06-13 NOTE — Progress Notes (Signed)
Office Visit    Patient Name: Claudia Barrett Date of Encounter: 06/17/2021  PCP:  Johny Blamer, MD    [facility]  Cardiologist:  Jodelle Red, MD  Advanced Practice Provider:  No care team member to display Electrophysiologist:  None      Chief Complaint    Claudia Barrett is a 60 y.o. female with a hx of hypertension, mild to moderate AI presents today for preop clearance   Past Medical History    Past Medical History:  Diagnosis Date   Dyspnea    HLD (hyperlipidemia)    Hypertension    Sciatica    Past Surgical History:  Procedure Laterality Date   ABDOMINAL HYSTERECTOMY     BREAST SURGERY      Allergies  Allergies  Allergen Reactions   Codeine Other (See Comments)    Extreme fatigue   Elemental Sulfur Nausea And Vomiting    History of Present Illness    Claudia Barrett is a 60 y.o. female with a hx of HTN, mild to moderate AI last seen 07/05/18.  Previously followed with Dr. Donnie Aho. Hospitalized 12/2016 with chest pain with normal lexiscan during admission. Echo 04/2018 with normal LV size, mild concentric LVH, no RWMA, EF 55%, mild to moderate AR, mild MR, trace TR, trace TR.   She was evaluated 07/05/18 by Dr. Cristal Deer in consult due to shortness of breath. She had ETT 07/2018 showing normal stress test with fair exercise tolerance.   She presents today for preoperative clearance for bilateral breast reduction scheduled for 06/18/21 with [facility] Dr. Arita Miss. 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 Endorses following a heart healthy diet. Tells me her neck and back pain has limited exercise and she is hopeful to be able to increase her exercise level after her breast reduction. She is excited about her second granddaughter being born late January. Her other granddaughter is nearly 17 years old and enjoy spending time with her. Reports no shortness of  breath nor dyspnea on exertion. Reports no chest pain, pressure, or tightness. No edema, orthopnea, PND. Reports no palpitations.    EKGs/Labs/Other Studies Reviewed:   The following studies were reviewed today:  ETT 07/2018 Blood pressure demonstrated a normal response to exercise. There was no ST segment deviation noted during stress. There were frequent PVCs during exercise. The patient exercised for 6 minutes achieving 86% MPHR with mildly impaired exercise tolerance reaching 7 mets. This is a low risk study.  Stress 01-02-17 FINDINGS: Perfusion: No decreased activity in the left ventricle on stress imaging to suggest reversible ischemia or infarction.   Wall Motion: Normal left ventricular wall motion. No left ventricular dilation.   Left Ventricular Ejection Fraction: 56 %  End diastolic volume 100 ml  End systolic volume 44 ml   IMPRESSION: 1. No reversible ischemia or infarction.  2. Normal left ventricular wall motion.  3. Left ventricular ejection fraction 56%  4. Non invasive risk stratification*: Low   echo 04/23/18 I cannot see the images, but the copy of the report notes normal LV cavity size (EDD 5.0 cm, ESD 3.6 cm), mild concentric LVH, normal wall motion, EF 55%. Mild-moderate AR. Mild MR, trace TR, trace PR.    EKG:  No EKG is ordered today.  The ekg independently reviewed from 06/14/21 demonstrated NSR 67 bpm with no acute ST/T wave changes.   Recent Labs: No results found for requested labs within  last 8760 hours.  Recent Lipid Panel    Component Value Date/Time   CHOL 180 01/02/2017 0522   TRIG 31 01/02/2017 0522   HDL 70 01/02/2017 0522   CHOLHDL 2.6 01/02/2017 0522   VLDL 6 01/02/2017 0522   LDLCALC 104 (H) 01/02/2017 0522     Home Medications   Current Meds  Medication Sig   amLODipine (NORVASC) 10 MG tablet Take 1 tablet (10 mg total) by mouth daily.   chlorthalidone (HYGROTON) 25 MG tablet Take 25 mg by mouth as needed. swelling    levocetirizine (XYZAL) 5 MG tablet as needed. Allergies, sinus   Naproxen Sodium (ALEVE PO) Take by mouth as needed. Sciatic nerve pain   pantoprazole (PROTONIX) 40 MG tablet Take 40 mg by mouth daily.   rosuvastatin (CRESTOR) 10 MG tablet Take 10 mg by mouth daily.     Review of Systems      All other systems reviewed and are otherwise negative except as noted above.  Physical Exam    VS:  BP 138/90   Pulse 78   Ht 5\' 6"  (1.676 m)   Wt 229 lb (103.9 kg)   SpO2 100%   BMI 36.96 kg/m  , BMI Body mass index is 36.96 kg/m.  Wt Readings from Last 3 Encounters:  06/17/21 229 lb (103.9 kg)  05/28/21 227 lb (103 kg)  04/18/21 227 lb (103 kg)     GEN: Well nourished, overweight, well developed, in no acute distress. HEENT: normal. Neck: Supple, no JVD, carotid bruits, or masses. Cardiac: RRR, no murmurs, rubs, or gallops. No clubbing, cyanosis, edema.  Radials/PT 2+ and equal bilaterally.  Respiratory:  Respirations regular and unlabored, clear to auscultation bilaterally. GI: Soft, nontender, nondistended. MS: No deformity or atrophy. Skin: Warm and dry, no rash. Neuro:  Strength and sensation are intact. Psych: Normal affect.  Assessment & Plan    Preop clearance - Bilateral breast reduction scheduled for tomorrow 06/18/21 with Dr. 08/18/21. According to the Revised Cardiac Risk Index (RCRI), her Perioperative Risk of Major Cardiac Event is (%): 0.4. Her  Functional Capacity in METs is: 8.33 according to the Duke Activity Status Index (DASI). She is deemed acceptable risk for the planned procedure and may proceed without additional testing. Will route to Dr. Michaela Corner and his team so they are aware.   HTN - BP mildly elevated today and reports usually 135/68 at home prior to medication. Encouraged to check after medications. Continue Chlorthalidone 25mg  QD and Amlodipine 10mg  QD. She will monitor at home and have follow up in 3 months to reassess. She believes previous intolerance to  Lisinopril with headache but could trial ARB. Heart healthy diet and regular cardiovascular exercise encouraged.    AI - Mld by echo 2019. No dyspnea, chest pain, near syncope, nor syncope. No indication to repeat echo at this time.   Disposition: Follow up in 3 month(s) with Dr. or APP.  Signed, , NP 06/17/2021, 8:36 AM [facility]

## 2021-06-14 ENCOUNTER — Telehealth: Payer: Self-pay

## 2021-06-14 ENCOUNTER — Encounter (HOSPITAL_BASED_OUTPATIENT_CLINIC_OR_DEPARTMENT_OTHER)
Admission: RE | Admit: 2021-06-14 | Discharge: 2021-06-14 | Disposition: A | Discharge status: Home / Self Care | Payer: 59 | Source: Ambulatory Visit | Attending: Plastic Surgery | Admitting: Plastic Surgery

## 2021-06-14 DIAGNOSIS — Z01810 Encounter for preprocedural cardiovascular examination: Secondary | ICD-10-CM | POA: Insufficient documentation

## 2021-06-14 NOTE — Progress Notes (Signed)
Patient was provided with CHG cleanser to use at home before the procedure. Patient verbalized understanding of instructions.  

## 2021-06-14 NOTE — Telephone Encounter (Signed)
Please advise. Thanks.  

## 2021-06-14 NOTE — Telephone Encounter (Signed)
Patient called to let us know that she just got her EKG today and would like to know if she needs to keep her appointment with the cardiologist on Monday.  Please call.

## 2021-06-14 NOTE — Progress Notes (Signed)
I called Claudia Barrett to let her know that the EKG she had done today was just for her PAT for surgery, not cardiac clearance. Encouraged her to keep her cards appt scheduled for 06-17-21

## 2021-06-14 NOTE — Progress Notes (Deleted)

## 2021-06-17 ENCOUNTER — Other Ambulatory Visit: Payer: Self-pay

## 2021-06-17 ENCOUNTER — Encounter (HOSPITAL_BASED_OUTPATIENT_CLINIC_OR_DEPARTMENT_OTHER): Payer: Self-pay | Admitting: Family

## 2021-06-17 ENCOUNTER — Ambulatory Visit (HOSPITAL_BASED_OUTPATIENT_CLINIC_OR_DEPARTMENT_OTHER): Payer: 59 | Admitting: Family

## 2021-06-17 VITALS — BP 138/90 | HR 78 | Ht 66.0 in | Wt 229.0 lb

## 2021-06-17 DIAGNOSIS — I351 Nonrheumatic aortic (valve) insufficiency: Secondary | ICD-10-CM

## 2021-06-17 DIAGNOSIS — Z01810 Encounter for preprocedural cardiovascular examination: Secondary | ICD-10-CM

## 2021-06-17 DIAGNOSIS — I10 Essential (primary) hypertension: Secondary | ICD-10-CM | POA: Diagnosis not present

## 2021-06-17 NOTE — Patient Instructions (Addendum)
Medication Instructions:  Continue your current medications.   *If you need a refill on your cardiac medications before your next appointment, please call your pharmacy*  Lab Work: None ordered today.   Testing/Procedures: None ordered. Your EKG from 06/14/21 showed normal sinus rhythm which is a good result!  Follow-Up: At [facility], you and your health needs are our priority.  As part of our continuing mission to provide you with exceptional heart care, we have created designated Provider Care Teams.  These Care Teams include your primary Cardiologist (physician) and Advanced Practice Providers (APPs -  Physician Assistants and Nurse Practitioners) who all work together to provide you with the care you need, when you need it.  We recommend signing up for the patient portal called "MyChart".  Sign up information is provided on this After Visit Summary.  MyChart is used to connect with patients for Virtual Visits (Telemedicine).  Patients are able to view lab/test results, encounter notes, upcoming appointments, etc.  Non-urgent messages can be sent to your provider as well.   To learn more about what you can do with MyChart, go to [email or web address].    Your next appointment:   In January with  Jodelle Red, MD as scheduled  Other Instructions  Alver Sorrow, NP will send a note to Dr. Arita Miss that you are cleared for your procedure tomorrow.   Tips to Measure your Blood Pressure Correctly Our goal is for your blood pressure to be less than 130/80 after your medications.   To determine whether you have hypertension, a medical professional will take a blood pressure reading. How you prepare for the test, the position of your arm, and other factors can change a blood pressure reading by 10% or more. That could be enough to hide high blood pressure, start you on a drug you don't really need, or lead your doctor to incorrectly adjust your medications.  National and  international guidelines offer specific instructions for measuring blood pressure. If a doctor, nurse, or medical assistant isn't doing it right, don't hesitate to ask him or her to get with the guidelines.  Here's what you can do to ensure a correct reading:  Don't drink a caffeinated beverage or smoke during the 30 minutes before the test.  Sit quietly for five minutes before the test begins.  During the measurement, sit in a chair with your feet on the floor and your arm supported so your elbow is at about heart level.  The inflatable part of the cuff should completely cover at least 80% of your upper arm, and the cuff should be placed on bare skin, not over a shirt.  Don't talk during the measurement.  Have your blood pressure measured twice, with a brief break in between. If the readings are different by 5 points or more, have it done a third time.  In 2017, new guidelines from the American Heart Association, the [employer] of Cardiology, and nine other health organizations lowered the diagnosis of high blood pressure to 130/80 mm Hg or higher for all adults. The guidelines also redefined the various blood pressure categories to now include normal, elevated, Stage 1 hypertension, Stage 2 hypertension, and hypertensive crisis (see "Blood pressure categories").  Blood pressure categories  Blood pressure category SYSTOLIC (upper number)  DIASTOLIC (lower number)  Normal Less than 120 mm Hg and Less than 80 mm Hg  Elevated 120-129 mm Hg and Less than 80 mm Hg  High blood pressure: Stage 1 hypertension 130-139 mm  Hg or 80-89 mm Hg  High blood pressure: Stage 2 hypertension 140 mm Hg or higher or 90 mm Hg or higher  Hypertensive crisis (consult your doctor immediately) Higher than 180 mm Hg and/or Higher than 120 mm Hg  Source: American Heart Association and American Stroke Association. For more on getting your blood pressure under control, buy Controlling Your Blood Pressure, a Special  Health Report from [facility]   Blood Pressure Log   Date   Time  Blood Pressure  Example: Nov 1 9 AM 124/78

## 2021-06-18 ENCOUNTER — Encounter (HOSPITAL_BASED_OUTPATIENT_CLINIC_OR_DEPARTMENT_OTHER)
Admission: RE | Disposition: A | Discharge status: Home / Self Care | Payer: Self-pay | Source: Ambulatory Visit | Attending: Plastic Surgery

## 2021-06-18 ENCOUNTER — Encounter (HOSPITAL_BASED_OUTPATIENT_CLINIC_OR_DEPARTMENT_OTHER): Payer: Self-pay | Admitting: Plastic Surgery

## 2021-06-18 ENCOUNTER — Other Ambulatory Visit: Payer: Self-pay

## 2021-06-18 ENCOUNTER — Ambulatory Visit (HOSPITAL_BASED_OUTPATIENT_CLINIC_OR_DEPARTMENT_OTHER)
Admission: RE | Admit: 2021-06-18 | Discharge: 2021-06-18 | Disposition: A | Discharge status: Home / Self Care | Payer: 59 | Source: Ambulatory Visit | Attending: Plastic Surgery | Admitting: Plastic Surgery

## 2021-06-18 ENCOUNTER — Telehealth: Payer: Self-pay

## 2021-06-18 ENCOUNTER — Ambulatory Visit (HOSPITAL_BASED_OUTPATIENT_CLINIC_OR_DEPARTMENT_OTHER): Payer: 59 | Admitting: Certified Registered"

## 2021-06-18 DIAGNOSIS — Z885 Allergy status to narcotic agent status: Secondary | ICD-10-CM | POA: Diagnosis not present

## 2021-06-18 DIAGNOSIS — M4004 Postural kyphosis, thoracic region: Secondary | ICD-10-CM

## 2021-06-18 DIAGNOSIS — Z79899 Other long term (current) drug therapy: Secondary | ICD-10-CM | POA: Diagnosis not present

## 2021-06-18 DIAGNOSIS — M545 Low back pain, unspecified: Secondary | ICD-10-CM

## 2021-06-18 DIAGNOSIS — N62 Hypertrophy of breast: Secondary | ICD-10-CM

## 2021-06-18 DIAGNOSIS — M546 Pain in thoracic spine: Secondary | ICD-10-CM

## 2021-06-18 HISTORY — DX: Dyspnea, unspecified: R06.00

## 2021-06-18 HISTORY — PX: BREAST REDUCTION SURGERY: SHX8

## 2021-06-18 SURGERY — MAMMOPLASTY, REDUCTION
Anesthesia: General | Site: Breast | Laterality: Bilateral

## 2021-06-18 MED ORDER — OXYCODONE HCL 5 MG PO TABS: 5.0000 mg | ORAL_TABLET | Freq: Once | ORAL | Status: DC | PRN

## 2021-06-18 MED ORDER — OXYCODONE HCL 5 MG/5ML PO SOLN
5.0000 mg | Freq: Once | ORAL | Status: DC | PRN
Start: 2021-06-18 — End: 2021-06-18

## 2021-06-18 MED ORDER — DEXAMETHASONE SODIUM PHOSPHATE 4 MG/ML IJ SOLN
INTRAMUSCULAR | Status: DC | PRN
  Administered 2021-06-18: 10 mg via INTRAVENOUS

## 2021-06-18 MED ORDER — PROPOFOL 500 MG/50ML IV EMUL
INTRAVENOUS | Status: DC | PRN
  Administered 2021-06-18: 30 ug/kg/min via INTRAVENOUS

## 2021-06-18 MED ORDER — PROPOFOL 10 MG/ML IV BOLUS
INTRAVENOUS | Status: DC | PRN
Start: 2021-06-18 — End: 2021-06-18
  Administered 2021-06-18: 200 mg via INTRAVENOUS

## 2021-06-18 MED ORDER — MIDAZOLAM HCL 5 MG/5ML IJ SOLN
INTRAMUSCULAR | Status: DC | PRN
  Administered 2021-06-18: 2 mg via INTRAVENOUS

## 2021-06-18 MED ORDER — AMISULPRIDE (ANTIEMETIC) 5 MG/2ML IV SOLN
INTRAVENOUS | Status: AC
  Filled 2021-06-18: qty 2

## 2021-06-18 MED ORDER — ROCURONIUM BROMIDE 100 MG/10ML IV SOLN
INTRAVENOUS | Status: DC | PRN
  Administered 2021-06-18: 60 mg via INTRAVENOUS

## 2021-06-18 MED ORDER — LACTATED RINGERS IV SOLN: INTRAVENOUS | Status: DC

## 2021-06-18 MED ORDER — SUGAMMADEX SODIUM 500 MG/5ML IV SOLN
INTRAVENOUS | Status: DC | PRN
  Administered 2021-06-18: 400 mg via INTRAVENOUS

## 2021-06-18 MED ORDER — AMISULPRIDE (ANTIEMETIC) 5 MG/2ML IV SOLN
10.0000 mg | Freq: Once | INTRAVENOUS | Status: AC | PRN
  Administered 2021-06-18: 10 mg via INTRAVENOUS

## 2021-06-18 MED ORDER — ONDANSETRON HCL 4 MG/2ML IJ SOLN
INTRAMUSCULAR | Status: DC | PRN
  Administered 2021-06-18: 4 mg via INTRAVENOUS

## 2021-06-18 MED ORDER — FENTANYL CITRATE (PF) 100 MCG/2ML IJ SOLN
INTRAMUSCULAR | Status: AC
  Filled 2021-06-18: qty 2

## 2021-06-18 MED ORDER — ONDANSETRON HCL 4 MG/2ML IJ SOLN: 4.0000 mg | Freq: Once | INTRAMUSCULAR | Status: DC | PRN

## 2021-06-18 MED ORDER — LACTATED RINGERS IV SOLN: INTRAVENOUS | Status: DC | PRN

## 2021-06-18 MED ORDER — TRANEXAMIC ACID-NACL 1000-0.7 MG/100ML-% IV SOLN
INTRAVENOUS | Status: AC
  Filled 2021-06-18: qty 100

## 2021-06-18 MED ORDER — TRANEXAMIC ACID-NACL 1000-0.7 MG/100ML-% IV SOLN
1000.0000 mg | INTRAVENOUS | Status: AC
  Administered 2021-06-18: 1000 mg via INTRAVENOUS

## 2021-06-18 MED ORDER — 0.9 % SODIUM CHLORIDE (POUR BTL) OPTIME
TOPICAL | Status: DC | PRN
  Administered 2021-06-18: 1000 mL

## 2021-06-18 MED ORDER — FENTANYL CITRATE (PF) 100 MCG/2ML IJ SOLN
INTRAMUSCULAR | Status: DC | PRN
  Administered 2021-06-18: 50 ug via INTRAVENOUS
  Administered 2021-06-18: 100 ug via INTRAVENOUS

## 2021-06-18 MED ORDER — CEFAZOLIN SODIUM-DEXTROSE 2-4 GM/100ML-% IV SOLN
INTRAVENOUS | Status: AC
  Filled 2021-06-18: qty 100

## 2021-06-18 MED ORDER — LIDOCAINE HCL (CARDIAC) PF 100 MG/5ML IV SOSY
PREFILLED_SYRINGE | INTRAVENOUS | Status: DC | PRN
  Administered 2021-06-18: 100 mg via INTRAVENOUS

## 2021-06-18 MED ORDER — FENTANYL CITRATE (PF) 100 MCG/2ML IJ SOLN
25.0000 ug | INTRAMUSCULAR | Status: DC | PRN
  Administered 2021-06-18: 50 ug via INTRAVENOUS

## 2021-06-18 MED ORDER — CEFAZOLIN SODIUM-DEXTROSE 2-4 GM/100ML-% IV SOLN
2.0000 g | INTRAVENOUS | Status: AC
  Administered 2021-06-18: 2 g via INTRAVENOUS

## 2021-06-18 MED ORDER — MIDAZOLAM HCL 2 MG/2ML IJ SOLN
INTRAMUSCULAR | Status: AC
  Filled 2021-06-18: qty 2

## 2021-06-18 SURGICAL SUPPLY — 55 items
APL PRP STRL LF DISP 70% ISPRP (MISCELLANEOUS) ×2
APL SKNCLS STERI-STRIP NONHPOA (GAUZE/BANDAGES/DRESSINGS) ×2
BENZOIN TINCTURE PRP APPL 2/3 (GAUZE/BANDAGES/DRESSINGS) ×4 IMPLANT
BLADE SURG 10 STRL SS (BLADE) ×4 IMPLANT
BNDG CMPR MED 10X6 ELC LF (GAUZE/BANDAGES/DRESSINGS) ×2
BNDG ELASTIC 6X10 VLCR STRL LF (GAUZE/BANDAGES/DRESSINGS) ×4 IMPLANT
CANISTER SUCT 1200ML W/VALVE (MISCELLANEOUS) ×2 IMPLANT
CHLORAPREP W/TINT 26 (MISCELLANEOUS) ×4 IMPLANT
COVER BACK TABLE 60X90IN (DRAPES) ×2 IMPLANT
COVER MAYO STAND STRL (DRAPES) ×2 IMPLANT
DRAIN PENROSE 1/2X12 LTX STRL (WOUND CARE) IMPLANT
DRAPE LAPAROSCOPIC ABDOMINAL (DRAPES) ×2 IMPLANT
DRAPE UTILITY XL STRL (DRAPES) ×2 IMPLANT
DRSG PAD ABDOMINAL 8X10 ST (GAUZE/BANDAGES/DRESSINGS) ×6 IMPLANT
ELECT REM PT RETURN 9FT ADLT (ELECTROSURGICAL) ×2
ELECTRODE REM PT RTRN 9FT ADLT (ELECTROSURGICAL) ×1 IMPLANT
GAUZE SPONGE 4X4 12PLY STRL (GAUZE/BANDAGES/DRESSINGS) ×4 IMPLANT
GLOVE SRG 8 PF TXTR STRL LF DI (GLOVE) IMPLANT
GLOVE SURG ENC MOIS LTX SZ7.5 (GLOVE) IMPLANT
GLOVE SURG ENC TEXT LTX SZ7.5 (GLOVE) ×2 IMPLANT
GLOVE SURG POLYISO LF SZ6.5 (GLOVE) ×2 IMPLANT
GLOVE SURG UNDER POLY LF SZ7 (GLOVE) ×4 IMPLANT
GLOVE SURG UNDER POLY LF SZ8 (GLOVE)
GOWN STRL REUS W/ TWL LRG LVL3 (GOWN DISPOSABLE) ×3 IMPLANT
GOWN STRL REUS W/TWL LRG LVL3 (GOWN DISPOSABLE) ×6
MARKER SKIN DUAL TIP RULER LAB (MISCELLANEOUS) IMPLANT
NDL SAFETY ECLIPSE 18X1.5 (NEEDLE) ×1 IMPLANT
NEEDLE FILTER BLUNT 18X 1/2SAF (NEEDLE) ×1
NEEDLE FILTER BLUNT 18X1 1/2 (NEEDLE) ×1 IMPLANT
NEEDLE HYPO 18GX1.5 SHARP (NEEDLE) ×2
NEEDLE HYPO 25X1 1.5 SAFETY (NEEDLE) IMPLANT
NEEDLE SPNL 18GX3.5 QUINCKE PK (NEEDLE) ×2 IMPLANT
NS IRRIG 1000ML POUR BTL (IV SOLUTION) ×2 IMPLANT
PACK BASIN DAY SURGERY FS (CUSTOM PROCEDURE TRAY) ×2 IMPLANT
PENCIL SMOKE EVACUATOR (MISCELLANEOUS) ×2 IMPLANT
SLEEVE SCD COMPRESS KNEE MED (STOCKING) ×2 IMPLANT
SPONGE T-LAP 18X18 ~~LOC~~+RFID (SPONGE) ×6 IMPLANT
STAPLER INSORB 30 2030 C-SECTI (MISCELLANEOUS) ×4 IMPLANT
STAPLER VISISTAT 35W (STAPLE) ×2 IMPLANT
STRIP SUTURE WOUND CLOSURE 1/2 (MISCELLANEOUS) ×6 IMPLANT
SUT MNCRL AB 4-0 PS2 18 (SUTURE) ×8 IMPLANT
SUT PDS 3-0 CT2 (SUTURE) ×6
SUT PDS II 3-0 CT2 27 ABS (SUTURE) ×3 IMPLANT
SUT VIC AB 3-0 PS1 18 (SUTURE)
SUT VIC AB 3-0 PS1 18XBRD (SUTURE) IMPLANT
SUT VLOC 180 P-14 24 (SUTURE) ×4 IMPLANT
SYR 50ML LL SCALE MARK (SYRINGE) IMPLANT
SYR BULB IRRIG 60ML STRL (SYRINGE) ×2 IMPLANT
SYR CONTROL 10ML LL (SYRINGE) IMPLANT
TAPE MEASURE VINYL STERILE (MISCELLANEOUS) IMPLANT
TOWEL GREEN STERILE FF (TOWEL DISPOSABLE) ×4 IMPLANT
TUBE CONNECTING 20X1/4 (TUBING) ×2 IMPLANT
TUBING INFILTRATION IT-10001 (TUBING) ×2 IMPLANT
UNDERPAD 30X36 HEAVY ABSORB (UNDERPADS AND DIAPERS) ×4 IMPLANT
YANKAUER SUCT BULB TIP NO VENT (SUCTIONS) ×2 IMPLANT

## 2021-06-18 NOTE — Telephone Encounter (Signed)
Patient's daughter called to say that the patient had surgery today.  Patient would like to know when she should start taking her antibiotic, today or tomorrow?  Also, patient took hydrocodone when she got home around 3:30pm.  She said that they gave her something for pain while she was in recovery and she isn't sure if it was the same thing.  Please call.

## 2021-06-18 NOTE — Anesthesia Procedure Notes (Signed)
Procedure Name: Intubation Date/Time: 06/18/2021 10:35 AM Performed by: Verita Lamb, CRNA Pre-anesthesia Checklist: Patient identified, Emergency Drugs available, Suction available and Patient being monitored Patient Re-evaluated:Patient Re-evaluated prior to induction Oxygen Delivery Method: Circle system utilized Preoxygenation: Pre-oxygenation with 100% oxygen Induction Type: IV induction Ventilation: Mask ventilation without difficulty Laryngoscope Size: Mac and 4 Grade View: Grade I Tube type: Oral Tube size: 7.0 mm Number of attempts: 1 Airway Equipment and Method: Stylet and Oral airway Placement Confirmation: ETT inserted through vocal cords under direct vision, positive ETCO2, breath sounds checked- equal and bilateral and CO2 detector Secured at: 22 cm Tube secured with: Tape Dental Injury: Teeth and Oropharynx as per pre-operative assessment

## 2021-06-18 NOTE — Telephone Encounter (Signed)
Called and spoke with the patient's daughter regarding the message below.  Informed the daughter that the patient does not have to take the antibiotics at all.    Informed the daughter that I'm not sure what the pain medication was that was given to the patient today.  But the patient can alternate Tylenol and Ibuprofen instead of taking the Hydrocodone.    Also informed the patient's daughter that the patient was given instructions for after breast surgery.  The daughter stated that she did not know that.  The daughter find the instructions and stated that will help.  The daughter verbalized understanding and agreed with everything.//AB/CMA

## 2021-06-18 NOTE — Transfer of Care (Signed)
Immediate Anesthesia Transfer of Care Note  Patient: Claudia Barrett  Procedure(s) Performed: MAMMARY REDUCTION  (BREAST) (Bilateral: Breast)  Patient Location: PACU  Anesthesia Type:General  Level of Consciousness: awake and alert   Airway & Oxygen Therapy: Patient Spontanous Breathing and Patient connected to face mask oxygen  Post-op Assessment: Report given to RN and Post -op Vital signs reviewed and stable  Post vital signs: Reviewed and stable  Last Vitals:  Vitals Value Taken Time  BP    Temp    Pulse 85 06/18/21 1206  Resp    SpO2 97 % 06/18/21 1206  Vitals shown include unvalidated device data.  Last Pain:  Vitals:   06/18/21 0841  TempSrc: Oral  PainSc: 0-No pain         Complications: No notable events documented.

## 2021-06-18 NOTE — Anesthesia Preprocedure Evaluation (Signed)
Anesthesia Evaluation  Patient identified by MRN, date of birth, ID band Patient awake    Reviewed: Allergy & Precautions, NPO status , Patient's Chart, lab work & pertinent test results  History of Anesthesia Complications Negative for: history of anesthetic complications  Airway Mallampati: II  TM Distance: >3 FB Neck ROM: Full    Dental  (+) Teeth Intact, Dental Advisory Given   Pulmonary neg pulmonary ROS,    Pulmonary exam normal        Cardiovascular hypertension, Normal cardiovascular exam   Echo 2019: mild LVH, EF 55%, mild-mod AI, mild MR Low risk stress test 2019   Neuro/Psych negative neurological ROS     GI/Hepatic negative GI ROS, Neg liver ROS,   Endo/Other  negative endocrine ROS  Renal/GU negative Renal ROS  negative genitourinary   Musculoskeletal negative musculoskeletal ROS (+)   Abdominal   Peds  Hematology negative hematology ROS (+)   Anesthesia Other Findings   Reproductive/Obstetrics                             Anesthesia Physical Anesthesia Plan  ASA: 2  Anesthesia Plan: General   Post-op Pain Management:    Induction: Intravenous  PONV Risk Score and Plan: 3 and Ondansetron, Dexamethasone, Treatment may vary due to age or medical condition and Midazolam  Airway Management Planned: Oral ETT  Additional Equipment: None  Intra-op Plan:   Post-operative Plan: Extubation in OR  Informed Consent: I have reviewed the patients History and Physical, chart, labs and discussed the procedure including the risks, benefits and alternatives for the proposed anesthesia with the patient or authorized representative who has indicated his/her understanding and acceptance.     Dental advisory given  Plan Discussed with:   Anesthesia Plan Comments:         Anesthesia Quick Evaluation

## 2021-06-18 NOTE — Discharge Instructions (Addendum)
Activity As tolerated: NO showers for 3 days. Keep ACE wrap on breasts until then. After showering, put ACE wrap back on, this is important for compression. NO driving while in pain, taking pain medication or if you are unable to safely react to traffic. No heavy activities Take Pain medication (Norco) as needed for severe pain. Otherwise, you can use ibuprofen or tylenol as needed. Avoid more than 3,000 mg of tylenol in 24 hours. Norco has 325mg of tylenol per dose.  Diet: Regular. Drink plenty of fluids and eat healthy, high protein, low carbs.  Wound Care: Keep dressing clean & dry. You may change bandages after showering if you continue to notice some drainage. You can reuse bandages if they are not dirty/soiled.  Special Instructions: Call Doctor if any unusual problems occur such as pain, excessive Bleeding, unrelieved Nausea/vomiting, Fever &/or chills  Follow-up appointment: Scheduled for next week.   Post Anesthesia Home Care Instructions  Activity: Get plenty of rest for the remainder of the day. A responsible individual must stay with you for 24 hours following the procedure.  For the next 24 hours, DO NOT: -Drive a car -Operate machinery -Drink alcoholic beverages -Take any medication unless instructed by your physician -Make any legal decisions or sign important papers.  Meals: Start with liquid foods such as gelatin or soup. Progress to regular foods as tolerated. Avoid greasy, spicy, heavy foods. If nausea and/or vomiting occur, drink only clear liquids until the nausea and/or vomiting subsides. Call your physician if vomiting continues.  Special Instructions/Symptoms: Your throat may feel dry or sore from the anesthesia or the breathing tube placed in your throat during surgery. If this causes discomfort, gargle with warm salt water. The discomfort should disappear within 24 hours.  If you had a scopolamine patch placed behind your ear for the management of post-  operative nausea and/or vomiting:  1. The medication in the patch is effective for 72 hours, after which it should be removed.  Wrap patch in a tissue and discard in the trash. Wash hands thoroughly with soap and water. 2. You may remove the patch earlier than 72 hours if you experience unpleasant side effects which may include dry mouth, dizziness or visual disturbances. 3. Avoid touching the patch. Wash your hands with soap and water after contact with the patch.       

## 2021-06-18 NOTE — Interval H&P Note (Signed)
History and Physical Interval Note:  06/18/2021 10:04 AM  Claudia Barrett  has presented today for surgery, with the diagnosis of macromatsia.  The various methods of treatment have been discussed with the patient and family. After consideration of risks, benefits and other options for treatment, the patient has consented to  Procedure(s) with comments: MAMMARY REDUCTION  (BREAST) (Bilateral) - 2 hours as a surgical intervention.  The patient's history has been reviewed, patient examined, no change in status, stable for surgery.  I have reviewed the patient's chart and labs.  Questions were answered to the patient's satisfaction.     Allena Napoleon

## 2021-06-18 NOTE — Op Note (Signed)
Operative Note   DATE OF OPERATION: 06/18/2021  LOCATION: [facility]   SURGICAL DEPARTMENT: Plastic Surgery  PREOPERATIVE DIAGNOSES: Bilateral symptomatic macromastia.  POSTOPERATIVE DIAGNOSES:  same  PROCEDURE: Bilateral breast reduction with superomedial pedicle.  SURGEON: Ancil Linsey, MD  ASSISTANT: Zadie Cleverly, PA The advanced practice practitioner (APP) assisted throughout the case.  The APP was essential in retraction and counter traction when needed to make the case progress smoothly.  This retraction and assistance made it possible to see the tissue planes for the procedure.  The assistance was needed for hemostasis, tissue re-approximation and closure of the incision site.   ANESTHESIA: General.  COMPLICATIONS: None.   INDICATIONS FOR PROCEDURE:  The patient, Claudia Barrett is a 60 y.o. female born on January 06, 1961, is here for treatment of bilateral symptomatic macromastia. MRN: [id number]  CONSENT:  Informed consent was obtained directly from the patient. Risks, benefits and alternatives were fully discussed. Specific risks including but not limited to bleeding, infection, hematoma, seroma, scarring, pain, infection, contracture, asymmetry, wound healing problems, and need for further surgery were all discussed. The patient did have an ample opportunity to have questions answered to satisfaction.   DESCRIPTION OF PROCEDURE:  The patient was marked preoperatively for a Wise pattern skin excision.  The patient was taken to the operating room. SCDs were placed and antibiotics were given. General anesthesia was administered.The patient's operative site was prepped and draped in a sterile fashion. A time out was performed and all information was confirmed to be correct.  Right Breast: The breast was infiltrated with tumescent solution to help with hemostasis.  The nipple was marked with a cookie cutter.  A superomedial pedicle was drawn out with the base  of at least 8 cm in size.  A breast tourniquet was then applied and the pedicle was de-epithelialized.  Breast tourniquet was then let down and all incisions were made with a 10 blade.  The pedicle was then isolated down to the chest wall with cautery and the excision was performed removing tissue primarily inferiorly and laterally.  Hemostasis was obtained and the wound was stapled closed.  Left breast:  The breast was infiltrated with tumescent solution to help with hemostasis.  The nipple was marked with a cookie cutter.  A superomedial pedicle was drawn out with the base of at least 8 cm in size.  A breast tourniquet was then applied and the pedicle was de-epithelialized.  Breast tourniquet was then let down and all incisions were made with a 10 blade.  The pedicle was then isolated down to the chest wall with cautery and the excision was performed removing tissue primarily inferiorly and laterally.  Hemostasis was obtained and the wound was stapled closed.  Patient was then set up to check for size and symmetry.  Minor modifications were made.  This resulted in a total of 1300g removed from the right side and 1331g removed from the left side.  The inframammary incision was closed with a combination of buried in-sorb staples and a running 3-0 Quill suture.  The vertical and periareolar limbs were closed with interrupted buried 4-0 Monocryl and a running 4-0 Quill suture.  Steri-Strips were then applied along with a soft dressing and Ace wrap.  The patient tolerated the procedure well.  There were no complications. The patient was allowed to wake from anesthesia, extubated and taken to the recovery room in satisfactory condition.  I was present for the entire procedure.

## 2021-06-18 NOTE — Anesthesia Postprocedure Evaluation (Signed)
Anesthesia Post Note  Patient: Cleopatra Sardo Blackwelder  Procedure(s) Performed: MAMMARY REDUCTION  (BREAST) (Bilateral: Breast)     Patient location during evaluation: PACU Anesthesia Type: General Level of consciousness: awake and alert Pain management: pain level controlled Vital Signs Assessment: post-procedure vital signs reviewed and stable Respiratory status: spontaneous breathing, nonlabored ventilation and respiratory function stable Cardiovascular status: blood pressure returned to baseline and stable Postop Assessment: no apparent nausea or vomiting Anesthetic complications: no   No notable events documented.  Last Vitals:  Vitals:   06/18/21 1300 06/18/21 1351  BP: (!) 154/84 (!) 156/90  Pulse: 68 70  Resp: (!) 21 20  Temp:  (!) 36.4 C  SpO2: 94% 98%    Last Pain:  Vitals:   06/18/21 1351  TempSrc:   PainSc: 3                  Lucretia Kern

## 2021-06-18 NOTE — Brief Op Note (Signed)
06/18/2021  11:59 AM  PATIENT:  Claudia Barrett  60 y.o. female  PRE-OPERATIVE DIAGNOSIS:  macromatsia  POST-OPERATIVE DIAGNOSIS:  macromatsia  PROCEDURE:  Procedure(s): MAMMARY REDUCTION  (BREAST) (Bilateral)  SURGEON:  Surgeon(s) and Role:    * Sueko Dimichele, Wendy Poet, MD - Primary  PHYSICIAN ASSISTANT: Materials engineer, PA  ASSISTANTS: none   ANESTHESIA:   general  EBL:  150 mL   BLOOD ADMINISTERED:none  DRAINS: none   LOCAL MEDICATIONS USED:  MARCAINE     SPECIMEN:  Source of Specimen:  r and l breast tissue  DISPOSITION OF SPECIMEN:  PATHOLOGY  COUNTS:  YES  TOURNIQUET:  * No tourniquets in log *  DICTATION: .Dragon Dictation  PLAN OF CARE: Discharge to home after PACU  PATIENT DISPOSITION:  PACU - hemodynamically stable.   Delay start of Pharmacological VTE agent (>24hrs) due to surgical blood loss or risk of bleeding: not applicable

## 2021-06-19 ENCOUNTER — Encounter (HOSPITAL_BASED_OUTPATIENT_CLINIC_OR_DEPARTMENT_OTHER): Payer: Self-pay | Admitting: Plastic Surgery

## 2021-06-19 LAB — SURGICAL PATHOLOGY

## 2021-06-26 ENCOUNTER — Ambulatory Visit (INDEPENDENT_AMBULATORY_CARE_PROVIDER_SITE_OTHER): Payer: 59 | Admitting: Plastic Surgery

## 2021-06-26 ENCOUNTER — Encounter: Payer: Self-pay | Admitting: Plastic Surgery

## 2021-06-26 ENCOUNTER — Other Ambulatory Visit: Payer: Self-pay

## 2021-06-26 DIAGNOSIS — N62 Hypertrophy of breast: Secondary | ICD-10-CM

## 2021-06-26 NOTE — Progress Notes (Signed)
Patient presents about 1 week out from bilateral breast reduction.  She feels good and is happy.  On exam everything looks to be healing fine with intact incisions and viable nipple areolar complexes.  There is no subcutaneous fluid.  I have asked her to continue compressive garments and avoid strenuous activity.  We will see her again in a few weeks.  

## 2021-07-10 ENCOUNTER — Encounter: Payer: 59 | Admitting: Surgical

## 2021-07-11 ENCOUNTER — Ambulatory Visit (HOSPITAL_BASED_OUTPATIENT_CLINIC_OR_DEPARTMENT_OTHER): Payer: 59 | Admitting: Family

## 2021-07-24 ENCOUNTER — Other Ambulatory Visit: Payer: Self-pay

## 2021-07-24 ENCOUNTER — Encounter: Payer: Self-pay | Admitting: Surgical

## 2021-07-24 ENCOUNTER — Ambulatory Visit (INDEPENDENT_AMBULATORY_CARE_PROVIDER_SITE_OTHER): Payer: 59 | Admitting: Surgical

## 2021-07-24 ENCOUNTER — Encounter: Payer: 59 | Admitting: Surgical

## 2021-07-24 DIAGNOSIS — M546 Pain in thoracic spine: Secondary | ICD-10-CM

## 2021-07-24 DIAGNOSIS — M4004 Postural kyphosis, thoracic region: Secondary | ICD-10-CM

## 2021-07-24 DIAGNOSIS — M545 Low back pain, unspecified: Secondary | ICD-10-CM

## 2021-07-24 DIAGNOSIS — N62 Hypertrophy of breast: Secondary | ICD-10-CM

## 2021-07-24 NOTE — Progress Notes (Signed)
Patient is a 60 year old female here for follow-up after bilateral breast reduction with Dr. Arita Miss on 06/18/2021.  She reports overall she feels as if things are going well, she is having a lot of tenderness and pulling sensation in the bilateral lateral breast/axilla with reaching up, lifting or bending over.  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  She is worried about doing heavy lifting at work and causing some postoperative issues.  She is not having any infectious symptoms.  She feels as if otherwise things are going well.  Chaperone present on exam On exam bilateral NAC's are viable, bilateral breast incisions are intact.  There is no subcutaneous fluid collections noted with palpation.  No erythema or cellulitic changes.  We discussed ongoing restrictions of no heavy lifting for the next few weeks, avoiding strenuous activities.  We discussed returning to work and given the tenderness she is having and difficulty with lifting she would like to have a little bit more time off work for recovery.  We will have her return at the end of November, 08/12/2021.  All of her questions were answered to her content.  Recommend following up in the next 6 weeks for reevaluation, if she has any questions or concerns we would happy to see her sooner.  Pictures were taken and placed in the patient's chart with patient's permission.

## 2021-09-19 ENCOUNTER — Encounter (HOSPITAL_BASED_OUTPATIENT_CLINIC_OR_DEPARTMENT_OTHER): Payer: Self-pay

## 2021-09-23 ENCOUNTER — Ambulatory Visit (HOSPITAL_BASED_OUTPATIENT_CLINIC_OR_DEPARTMENT_OTHER): Payer: 59 | Admitting: Cardiology

## 2021-09-23 NOTE — Progress Notes (Incomplete)
Cardiology Office Note:    Date:  09/23/2021   ID:  Claudia Barrett, DOB August 09, 1961, MRN [id number]  PCP:  Shirline Frees, MD  Cardiologist:  Dr. Charletta Cousin to Dr. Buford Dresser, MD PhD  Referring MD: Shirline Frees, MD   CC: shortness of breath  History of Present Illness:    Claudia Barrett is a 61 y.o. female with a hx of hypertension, mild-moderate aortic regurgitation who is seen for follow-up. I initially met her 07/05/2018 as a new patient for the evaluation and management of shortness of breath.  Per Dr. Thurman Coyer note dated 01/06/17, she had recently been hospitalized for substernal chest discomfort. She had a normal lexiscan during that admission. However, she continued to have intermittent discomfort. I do not have additional notes after that visit.   She did have an echo on 04/23/18, with the indication noted as dyspnea and aortic regurgitation. I cannot see the images, but the copy of the report notes normal LV cavity size (EDD 5.0 cm, ESD 3.6 cm), mild concentric LVH, normal wall motion, EF 55%. Mild-moderate AR. Mild MR, trace TR, trace PR.  Last Appointment: She reports that she saw Dr. Wynonia Lawman about a month ago for shortness of breath. She could barely walk to the mailbox without feeling short of breath. She was told that her valve was leaky and didn't close the entire way. She was also told she had fat around her heart, and she has been working on losing weight because of this. She has been walking with her husband and feels short of breath when she walks uphill. Can take about three flights of stairs (5 steps each) before she gets short of breath. Used to be able to do may more flights. Her husband now outwalks her. She can do a mile at a slow pace before her breathing is limited (takes about 30 minutes to walk a mile she thinks). She had the lexiscan done when she was feeling short of breath.   Has been working on weight loss. Peaked at 226 lbs, down to 215  today.  BP runs 110s-130s/70s-80s.Never had trouble until after her hysterectomy. Notes that her BP went very high with surgery.   No chest pain, PND, orthopnea, LE edema, syncope, palpitations.  Today:   She denies any palpitations, chest pain, or shortness of breath. No lightheadedness, headaches, syncope, orthopnea, PND, lower extremity edema or exertional symptoms.  (+)  Past Medical History:  Diagnosis Date   Dyspnea    HLD (hyperlipidemia)    Hypertension    Sciatica     Past Surgical History:  Procedure Laterality Date   ABDOMINAL HYSTERECTOMY     BREAST REDUCTION SURGERY Bilateral 06/18/2021   Procedure: MAMMARY REDUCTION  (BREAST);  Surgeon: Cindra Presume, MD;  Location: [facility];  Service: Plastics;  Laterality: Bilateral;   BREAST SURGERY      Current Medications: Current Outpatient Medications on File Prior to Visit  Medication Sig   amLODipine (NORVASC) 10 MG tablet Take 1 tablet (10 mg total) by mouth daily.   chlorthalidone (HYGROTON) 25 MG tablet Take 25 mg by mouth as needed. swelling   levocetirizine (XYZAL) 5 MG tablet as needed. Allergies, sinus   Naproxen Sodium (ALEVE PO) Take by mouth as needed. Sciatic nerve pain   pantoprazole (PROTONIX) 40 MG tablet Take 40 mg by mouth daily.   rosuvastatin (CRESTOR) 10 MG tablet Take 10 mg by mouth daily.   No current facility-administered medications on file prior to  visit.   Has tried lisinopril in the past and felt poorly.  Takes meloxicam for leg pain, also awaiting PPI prescription.  Allergies:   Codeine and Elemental sulfur   Social History   Socioeconomic History   Marital status: Married    Spouse name: Not on file   Number of children: Not on file   Years of education: Not on file   Highest education level: Not on file  Occupational History   Not on file  Tobacco Use   Smoking status: Never   Smokeless tobacco: Never  Substance and Sexual Activity   Alcohol use: No    Drug use: No   Sexual activity: Not on file  Other Topics Concern   Not on file  Social History Narrative   Not on file   Social Determinants of Health   Financial Resource Strain: Not on file  Food Insecurity: Not on file  Transportation Needs: Not on file  Physical Activity: Not on file  Stress: Not on file  Social Connections: Not on file     Family History: The patient's family history includes Arrhythmia in her father; Arthritis in her mother; Hypertension in her mother. There is no history of Heart attack.  ROS:   Please see the history of present illness.  Additional pertinent ROS:  All other systems are reviewed and negative.    EKGs/Labs/Other Studies Reviewed:    The following studies were reviewed today:  ETT 08/06/2018: Blood pressure demonstrated a normal response to exercise. There was no ST segment deviation noted during stress. There were frequent PVCs during exercise. The patient exercised for 6 minutes achieving 86% MPHR with mildly impaired exercise tolerance reaching 7 mets. This is a low risk study.  echo 04/23/18 I cannot see the images, but the copy of the report notes normal LV cavity size (EDD 5.0 cm, ESD 3.6 cm), mild concentric LVH, normal wall motion, EF 55%. Mild-moderate AR. Mild MR, trace TR, trace PR.  Stress 01-02-17 FINDINGS: Perfusion: No decreased activity in the left ventricle on stress imaging to suggest reversible ischemia or infarction.   Wall Motion: Normal left ventricular wall motion. No left ventricular dilation.   Left Ventricular Ejection Fraction: 56 %  End diastolic volume 517 ml  End systolic volume 44 ml   IMPRESSION: 1. No reversible ischemia or infarction.  2. Normal left ventricular wall motion.  3. Left ventricular ejection fraction 56%  4. Non invasive risk stratification*: Low   EKG:  EKG is personally reviewed. 09/23/2021: EKG was not ordered. 07/05/2018: normal sinus rhythm  Recent Labs: No results  found for requested labs within last 8760 hours.   Recent Lipid Panel    Component Value Date/Time   CHOL 180 01/02/2017 0522   TRIG 31 01/02/2017 0522   HDL 70 01/02/2017 0522   CHOLHDL 2.6 01/02/2017 0522   VLDL 6 01/02/2017 0522   LDLCALC 104 (H) 01/02/2017 0522  Updated lipid panel below  Physical Exam:    VS:  There were no vitals taken for this visit.    Wt Readings from Last 3 Encounters:  06/18/21 227 lb 8.2 oz (103.2 kg)  06/17/21 229 lb (103.9 kg)  05/28/21 227 lb (103 kg)   Peaked at 226 lbs.   GEN: Well nourished, well developed in no acute distress HEENT: Normal NECK: No JVD; No carotid bruits LYMPHATICS: No lymphadenopathy CARDIAC: regular rhythm, normal S1 and S2, no murmurs, rubs, gallops. Radial and DP pulses 2+ bilaterally. RESPIRATORY:  Clear to  auscultation without rales, wheezing or rhonchi  ABDOMEN: Soft, non-tender, non-distended MUSCULOSKELETAL:  No edema; No deformity  SKIN: Warm and dry NEUROLOGIC:  Alert and oriented x 3 PSYCHIATRIC:  Normal affect   ASSESSMENT:    No diagnosis found.  PLAN:    Dyspnea on exertion, mild-moderate aortic regurgitation, hypertension: my suspicion is that all of these are intertwined. If her blood pressure elevates with activity, she will have increased afterload and likely worsening aortic regurgitation. It would be helpful to know what her blood pressure does with exercise. While she had a negative lexiscan in the past, she can exercise, so an exercise treadmill test would also evaluate ischemia -exercise treadmill (no diabetes history, no imaging required)  Prevention counseling -recommend heart healthy/Mediterranean diet, with whole grains, fruits, vegetable, fish, lean meats, nuts, and olive oil. Limit salt. -recommend moderate walking, 3-5 times/week for 30-50 minutes each session. Aim for at least 150 minutes.week. Goal should be pace of 3 miles/hours, or walking 1.5 miles in 30 minutes -recommend avoidance  of tobacco products. Avoid excess alcohol. -Additional risk factor control:  -Diabetes: A1c is not recently documented  -Lipids: HDL 66, LDL 150, Tchol 229, TG 62.  From 12/2017  -Blood pressure control: 130s/80s, though suspect it may go higher with exercise, as above  -Weight: working on weight loss  Plan for follow up: ***3 mos or sooner as needed.  Medication Adjustments/Labs and Tests Ordered: Current medicines are reviewed at length with the patient today.  Concerns regarding medicines are outlined above.   No orders of the defined types were placed in this encounter.  No orders of the defined types were placed in this encounter.  There are no Patient Instructions on file for this visit.   I,Mathew Stumpf,acting as a Education administrator for [employer], MD.,have documented all relevant documentation on the behalf of Buford Dresser, MD,as directed by  Buford Dresser, MD while in the presence of Buford Dresser, MD.  ***  Signed, Buford Dresser, MD PhD 09/23/2021 7:21 AM    [place]

## 2021-10-14 ENCOUNTER — Ambulatory Visit (HOSPITAL_BASED_OUTPATIENT_CLINIC_OR_DEPARTMENT_OTHER): Payer: 59 | Admitting: Cardiology

## 2022-03-19 ENCOUNTER — Other Ambulatory Visit: Payer: Self-pay

## 2022-03-19 ENCOUNTER — Encounter (HOSPITAL_COMMUNITY): Payer: Self-pay | Admitting: Emergency Medicine

## 2022-03-19 ENCOUNTER — Emergency Department (HOSPITAL_COMMUNITY)
Admission: EM | Admit: 2022-03-19 | Discharge: 2022-03-20 | Disposition: A | Discharge status: Home / Self Care | Payer: 59 | Attending: Emergency Medicine | Admitting: Emergency Medicine

## 2022-03-19 DIAGNOSIS — R519 Headache, unspecified: Secondary | ICD-10-CM | POA: Insufficient documentation

## 2022-03-19 DIAGNOSIS — R42 Dizziness and giddiness: Secondary | ICD-10-CM | POA: Diagnosis present

## 2022-03-19 DIAGNOSIS — R110 Nausea: Secondary | ICD-10-CM | POA: Insufficient documentation

## 2022-03-19 LAB — COMPREHENSIVE METABOLIC PANEL
ALT: 12 U/L (ref 0–44)
AST: 22 U/L (ref 15–41)
Albumin: 4.3 g/dL (ref 3.5–5.0)
Alkaline Phosphatase: 72 U/L (ref 38–126)
Anion gap: 13 (ref 5–15)
BUN: 18 mg/dL (ref 6–20)
CO2: 26 mmol/L (ref 22–32)
Calcium: 9.6 mg/dL (ref 8.9–10.3)
Chloride: 99 mmol/L (ref 98–111)
Creatinine, Ser: 0.99 mg/dL (ref 0.44–1.00)
GFR, Estimated: >60 mL/min (ref >60)
Glucose, Bld: 110 mg/dL — ABNORMAL HIGH (ref 70–99)
Potassium: 3.3 mmol/L — ABNORMAL LOW (ref 3.5–5.1)
Sodium: 138 mmol/L (ref 135–145)
Total Bilirubin: 0.8 mg/dL (ref 0.3–1.2)
Total Protein: 7.9 g/dL (ref 6.5–8.1)

## 2022-03-19 LAB — I-STAT CHEM 8, ED
BUN: 19 mg/dL (ref 6–20)
Calcium, Ion: 1.08 mmol/L — ABNORMAL LOW (ref 1.15–1.40)
Chloride: 100 mmol/L (ref 98–111)
Creatinine, Ser: 1 mg/dL (ref 0.44–1.00)
Glucose, Bld: 107 mg/dL — ABNORMAL HIGH (ref 70–99)
HCT: 37 % (ref 36.0–46.0)
Hemoglobin: 12.6 g/dL (ref 12.0–15.0)
Potassium: 3.2 mmol/L — ABNORMAL LOW (ref 3.5–5.1)
Sodium: 138 mmol/L (ref 135–145)
TCO2: 26 mmol/L (ref 22–32)

## 2022-03-19 LAB — DIFFERENTIAL
Abs Immature Granulocytes: 0.01 10*3/uL (ref 0.00–0.07)
Basophils Absolute: 0 10*3/uL (ref 0.0–0.1)
Basophils Relative: 0 %
Eosinophils Absolute: 0.1 10*3/uL (ref 0.0–0.5)
Eosinophils Relative: 2 %
Immature Granulocytes: 0 %
Lymphocytes Relative: 27 %
Lymphs Abs: 1.9 10*3/uL (ref 0.7–4.0)
Monocytes Absolute: 0.4 10*3/uL (ref 0.1–1.0)
Monocytes Relative: 6 %
Neutro Abs: 4.5 10*3/uL (ref 1.7–7.7)
Neutrophils Relative %: 65 %

## 2022-03-19 LAB — RAPID URINE DRUG SCREEN, HOSP PERFORMED
Amphetamines: NOT DETECTED
Barbiturates: NOT DETECTED
Benzodiazepines: NOT DETECTED
Cocaine: NOT DETECTED
Opiates: NOT DETECTED
Tetrahydrocannabinol: NOT DETECTED

## 2022-03-19 LAB — CBC
HCT: 36.9 % (ref 36.0–46.0)
Hemoglobin: 11.8 g/dL — ABNORMAL LOW (ref 12.0–15.0)
MCH: 30.2 pg (ref 26.0–34.0)
MCHC: 32 g/dL (ref 30.0–36.0)
MCV: 94.4 fL (ref 80.0–100.0)
Platelets: 223 10*3/uL (ref 150–400)
RBC: 3.91 MIL/uL (ref 3.87–5.11)
RDW: 11.9 % (ref 11.5–15.5)
WBC: 7 10*3/uL (ref 4.0–10.5)
nRBC: 0 % (ref 0.0–0.2)

## 2022-03-19 LAB — URINALYSIS, ROUTINE W REFLEX MICROSCOPIC
Bilirubin Urine: NEGATIVE
Glucose, UA: NEGATIVE mg/dL
Hgb urine dipstick: NEGATIVE
Ketones, ur: NEGATIVE mg/dL
Leukocytes,Ua: NEGATIVE
Nitrite: NEGATIVE
Protein, ur: NEGATIVE mg/dL
Specific Gravity, Urine: 1.005 (ref 1.005–1.030)
pH: 5 (ref 5.0–8.0)

## 2022-03-19 LAB — I-STAT BETA HCG BLOOD, ED (MC, WL, AP ONLY): I-stat hCG, quantitative: <5 m[IU]/mL (ref <5)

## 2022-03-19 LAB — ETHANOL: Alcohol, Ethyl (B): <10 mg/dL (ref <10)

## 2022-03-19 LAB — APTT: aPTT: 35 seconds (ref 24–36)

## 2022-03-19 LAB — PROTIME-INR
INR: 1 (ref 0.8–1.2)
Prothrombin Time: 13.2 seconds (ref 11.4–15.2)

## 2022-03-19 NOTE — ED Triage Notes (Addendum)
Per EMS, pt from home, at 2000 she has a sudden onset of sudden onset dizziness and headache.  Headache is a 10/10 stabbing pain.  Pt reports room is spinning.   No other neuro deficits at this time.    Pt clarified that she has had a headache and neck pain all day and the dizziness started at 2000.   200/100 no 160/80 CBG 95 A/o X 4 20G L AC

## 2022-03-19 NOTE — ED Provider Triage Note (Signed)
Emergency Medicine Provider Triage Evaluation Note  Claudia Barrett , a 61 y.o. female  was evaluated in triage.  Pt complains of dizziness.  Patient reports dizziness started around 8 PM.  She reports that she feels somewhat lightheaded but also reported a room spinning sensation.  Reports just feeling generally unwell and sick but denies vomiting or nausea.  No chest pain, shortness of breath, palpitations or abdominal pain.  Reports that this morning when she got up she noticed some pain in the right side of her neck with mild headache.  She reports some associated intermittent blurred vision, no diplopia.  No numbness, tingling or weakness in her extremities.  No falls.  No history of similar symptoms. .  Review of Systems  Positive: Dizziness, malaise, blurred vision, headache, neck pain Negative: Chest pain, shortness of breath, palpitations  Physical Exam  Ht 5\' 6"  (1.676 m)   Wt 101.2 kg   BMI 35.99 kg/m  Gen:   Awake, no distress   Resp:  Normal effort  MSK:   Moves extremities without difficulty  Other:  Cranial nerves intact, 5/5 strength in bilateral upper and lower extremities, normal sensation in all extremities.  No pronator drift.  Medical Decision Making  Medically screening exam initiated at 10:17 PM.  Appropriate orders placed.  Ahsley Attwood Beavin was informed that the remainder of the evaluation will be completed by another provider, this initial triage assessment does not replace that evaluation, and the importance of remaining in the ED until their evaluation is complete.  No focal neurodeficits, concern for potential near syncopal symptoms, versus ago, also considered vertebral artery dissection.  Will initiate work-up from triage with head CT and lab work.   Austin Miles, Dartha Lodge 03/19/22 2230

## 2022-03-20 ENCOUNTER — Emergency Department (HOSPITAL_COMMUNITY): Payer: 59

## 2022-03-20 ENCOUNTER — Encounter (HOSPITAL_COMMUNITY): Payer: Self-pay

## 2022-03-20 MED ORDER — LORAZEPAM 2 MG/ML IJ SOLN
1.0000 mg | Freq: Once | INTRAMUSCULAR | Status: AC
  Administered 2022-03-20: 1 mg via INTRAVENOUS
  Filled 2022-03-20: qty 1

## 2022-03-20 MED ORDER — MECLIZINE HCL 25 MG PO TABS: 25.0000 mg | ORAL_TABLET | Freq: Three times a day (TID) | ORAL | 0 refills | Status: AC | PRN

## 2022-03-20 MED ORDER — IOHEXOL 350 MG/ML SOLN
100.0000 mL | Freq: Once | INTRAVENOUS | Status: AC | PRN
  Administered 2022-03-20: 75 mL via INTRAVENOUS

## 2022-03-20 NOTE — ED Provider Notes (Signed)
[facility] EMERGENCY DEPARTMENT  Provider Note  CSN: [id number] Arrival date & time: 03/19/22 2202  History Chief Complaint  Patient presents with   Dizziness    Claudia Barrett is a 61 y.o. female brought to the ED via EMS for evaluation of dizziness. She has her husband and daughter with her who help supplement history. She reports she has had more or less daily headaches for the last several months. Saw PCP who increased her BP meds but otherwise has not had any evaluation of same. She reports she occasional gets some blurry vision with that but does not describe scotoma. In the last few days she has also had waxing and waning R neck pain which was present with headache when she woke up this morning but has since resolved. She had an episode of blurry vision around 2pm today which has also resolved. Around 8pm, while sitting and resting she began having an episode of dizziness. Does not describe room spinning to me but did report that in triage. She had some nausea but no vomiting. No recent fevers, cough, congestion or other acute symptoms. Does not feel off balance with walking, no numbness, tingling or weakness in extremities. She is currently asymptomatic.    Home Medications Prior to Admission medications   Medication Sig Start Date End Date Taking? Authorizing Provider  meclizine (ANTIVERT) 25 MG tablet Take 1 tablet (25 mg total) by mouth 3 (three) times daily as needed for dizziness. 03/20/22  Yes Pollyann Savoy, MD  amLODipine (NORVASC) 10 MG tablet Take 1 tablet (10 mg total) by mouth daily. 01/02/17   Leroy Sea, MD  chlorthalidone (HYGROTON) 25 MG tablet Take 25 mg by mouth as needed. swelling 03/06/21   [provider]  levocetirizine (XYZAL) 5 MG tablet as needed. Allergies, sinus 05/25/21   [provider]  Naproxen Sodium (ALEVE PO) Take by mouth as needed. Sciatic nerve pain    [provider]  pantoprazole (PROTONIX) 40  MG tablet Take 40 mg by mouth daily. 05/26/21   [provider]  rosuvastatin (CRESTOR) 10 MG tablet Take 10 mg by mouth daily. 05/26/21   [provider]     Allergies    Codeine and Elemental sulfur   Review of Systems   Review of Systems Please see HPI for pertinent positives and negatives  Physical Exam BP 108/61   Pulse 92   Temp 98.6 F (37 C)   Resp 16   Ht 5\' 6"  (1.676 m)   Wt 101.2 kg   SpO2 92%   BMI 35.99 kg/m   Physical Exam Vitals and nursing note reviewed.  Constitutional:      Appearance: Normal appearance.  HENT:     Head: Normocephalic and atraumatic.     Nose: Nose normal.     Mouth/Throat:     Mouth: Mucous membranes are moist.  Eyes:     Extraocular Movements: Extraocular movements intact.     Conjunctiva/sclera: Conjunctivae normal.  Cardiovascular:     Rate and Rhythm: Normal rate.  Pulmonary:     Effort: Pulmonary effort is normal.     Breath sounds: Normal breath sounds.  Abdominal:     General: Abdomen is flat.     Palpations: Abdomen is soft.     Tenderness: There is no abdominal tenderness.  Musculoskeletal:        General: No swelling. Normal range of motion.     Cervical back: Neck supple. No rigidity  or tenderness.  Skin:    General: Skin is warm and dry.  Neurological:     General: No focal deficit present.     Mental Status: She is alert and oriented to person, place, and time.     Cranial Nerves: No cranial nerve deficit.     Sensory: No sensory deficit.     Motor: No weakness.     Coordination: Coordination normal.  Psychiatric:        Mood and Affect: Mood normal.     ED Results / Procedures / Treatments   EKG EKG Interpretation  Date/Time:  Thursday March 20 2022 00:55:17 EDT Ventricular Rate:  65 PR Interval:  156 QRS Duration: 110 QT Interval:  462 QTC Calculation: 481 R Axis:   36 Text Interpretation: Sinus rhythm Low voltage, precordial leads Abnormal R-wave progression, early transition  No significant change since last tracing Confirmed by Calvert Cantor [phone]) on 03/20/2022 1:26:37 AM  Procedures Procedures  Medications Ordered in the ED Medications  LORazepam (ATIVAN) injection 1 mg (1 mg Intravenous Given 03/20/22 0233)  iohexol (OMNIPAQUE) 350 MG/ML injection 100 mL (75 mLs Intravenous Contrast Given 03/20/22 0234)    Initial Impression and Plan  Patient with an unusual constellation of neurologic symptoms over the last several weeks, worse today to the point of calling EMS. She is feeling better now. Labs ordered in triage showed normal CBC, BMP, UDS, EtOH coags, and UA. I personally viewed the images from radiology studies and agree with radiologist interpretation: CT head is normal. Given her reported symptoms, will send for CTA head/neck to rule out carotid dissection or aneurysm. Will also send for MRI to rule out posterior circulation stroke. If negative anticipate discharge with outpatient Neurology referral.    ED Course   Clinical Course as of 03/20/22 0334  Thu Mar 20, 2022  0305 I personally viewed the images from radiology studies and agree with radiologist interpretation: CTA is neg for acute process.   [CS]  0330 I personally viewed the images from radiology studies and agree with radiologist interpretation: MRI is neg for acute stroke.   [CS]  [id number] Patient continues to rest comfortably with no symptoms. Plan outpatient referral to Neurology for further evaluation of her headaches. Rx for Meclizine for what sounds most like vertiginous dizziness. RTED for any other concerns.  [CS]    Clinical Course User Index [CS] Truddie Hidden, MD     MDM Rules/Calculators/A&P Medical Decision Making Given presenting complaint, I considered that admission might be necessary. After review of results from ED lab and/or imaging studies, admission to the hospital is not indicated at this time.    Problems Addressed: Dizziness: acute illness or  injury Nonintractable episodic headache, unspecified headache type: chronic illness or injury with exacerbation, progression, or side effects of treatment  Amount and/or Complexity of Data Reviewed Labs: ordered. Decision-making details documented in ED Course. Radiology: ordered and independent interpretation performed. Decision-making details documented in ED Course. ECG/medicine tests: ordered and independent interpretation performed. Decision-making details documented in ED Course.  Risk Prescription drug management. Decision regarding hospitalization.    Final Clinical Impression(s) / ED Diagnoses Final diagnoses:  Dizziness  Nonintractable episodic headache, unspecified headache type    Rx / DC Orders ED Discharge Orders          Ordered    meclizine (ANTIVERT) 25 MG tablet  3 times daily PRN        03/20/22 0334  Pollyann Savoy, MD 03/20/22 [phone]

## 2022-03-20 NOTE — ED Notes (Signed)
Patient verbalizes understanding of d/c instructions. Opportunities for questions and answers were provided. Pt d/c from ED and wheeled to lobby with family.  

## 2022-03-20 NOTE — ED Notes (Signed)
Patient transported to CT 

## 2022-03-20 NOTE — ED Notes (Signed)
Patient transported to MRI 

## 2022-04-04 ENCOUNTER — Ambulatory Visit: Payer: 59 | Admitting: Neurology

## 2022-04-04 ENCOUNTER — Encounter: Payer: Self-pay | Admitting: Neurology

## 2022-04-04 VITALS — BP 128/82 | HR 47 | Ht 66.0 in | Wt 223.0 lb

## 2022-04-04 DIAGNOSIS — G4486 Cervicogenic headache: Secondary | ICD-10-CM

## 2022-04-04 MED ORDER — TIZANIDINE HCL 4 MG PO TABS: 4.0000 mg | ORAL_TABLET | Freq: Three times a day (TID) | ORAL | 0 refills | Status: DC | PRN

## 2022-04-04 NOTE — Patient Instructions (Signed)
Trial of tizanidine 4 cervicalgia Referral to PT Continue to follow with your PCP Follow-up in 3 months or sooner if worse

## 2022-04-04 NOTE — Progress Notes (Signed)
[facility]  PATIENT: Claudia Barrett DOB: Dec 01, 1960  REQUESTING CLINICIAN: Johny Blamer, MD HISTORY FROM: Patient and daughter  REASON FOR VISIT: Headache and dizziness    HISTORICAL  CHIEF COMPLAINT:  Chief Complaint  Patient presents with   New Patient (Initial Visit)    Rm 12, alone  ED referral for headaches and dizziness/ Reports daily headaches in the morning     HISTORY OF PRESENT ILLNESS:  This is a 61 year old woman with PMHx of hypertension, hyperlipidemia, who is presenting with complaint of daily headache and episode of dizziness.  Patient reports for the past 2 months she has been having morning headaches, headache start from the back of the head and sometimes on the middle of the head.  Headache lasts usually couple hours until she takes BC powder.  She denies any previous history of migraine headaches.  There are no associated photophobia or phonophobia, nausea or vomiting.  Patient reports on July 6 she had a severe episode of dizziness which prompted her to go to the hospital.  In the ED she had a CT head which was negative for acute stroke, CT angiogram head and neck with no large vessel occlusion and her MRI brain was normal.  She was discharged home with meclizine.  Since discharge from the hospital she has not had any additional episode of dizziness but still complains of morning headaches.     Headache History and Characteristics: Onset: 2 months  Location: back of neck Quality:  aggravating, sometimes wants to lay head down  Duration: last couple hours  Migrainous Features: None Aura: No  History of brain injury or tumor: No  Family history: Mother and daughter  Motion sickness: yes, only once  Cardiac history: no  OTC: BC Powder  Caffeine: Yes Sleep: 7 hrs  Mood/ Stress:   Prior prophylaxis: Propranolol: No  Verapamil:No TCA: No Topamax: No Depakote: No Effexor: No Cymbalta: No Neurontin:No  Prior  abortives: Triptan: No Anti-emetic: No Steroids: No Ergotamine suppository: No    OTHER MEDICAL CONDITIONS: Hypertension, Hyperlipidemia,    REVIEW OF SYSTEMS: Full 14 system review of systems performed and negative with exception of: as noted in the HPI   ALLERGIES: Allergies  Allergen Reactions   Codeine Other (See Comments)    Extreme fatigue   Elemental Sulfur Nausea And Vomiting    HOME MEDICATIONS: Outpatient Medications Prior to Visit  Medication Sig Dispense Refill   amLODipine (NORVASC) 10 MG tablet Take 1 tablet (10 mg total) by mouth daily. 30 tablet 0   chlorthalidone (HYGROTON) 25 MG tablet Take 25 mg by mouth as needed. swelling     levocetirizine (XYZAL) 5 MG tablet as needed. Allergies, sinus     meclizine (ANTIVERT) 25 MG tablet Take 1 tablet (25 mg total) by mouth 3 (three) times daily as needed for dizziness. 30 tablet 0   Naproxen Sodium (ALEVE PO) Take by mouth as needed. Sciatic nerve pain     pantoprazole (PROTONIX) 40 MG tablet Take 40 mg by mouth daily.     rosuvastatin (CRESTOR) 10 MG tablet Take 10 mg by mouth daily.     No facility-administered medications prior to visit.    PAST MEDICAL HISTORY: Past Medical History:  Diagnosis Date   Dyspnea    HLD (hyperlipidemia)    Hypertension    Sciatica     PAST SURGICAL HISTORY: Past Surgical History:  Procedure Laterality Date   ABDOMINAL HYSTERECTOMY     BREAST REDUCTION SURGERY  Bilateral 06/18/2021   Procedure: MAMMARY REDUCTION  (BREAST);  Surgeon: Allena Napoleon, MD;  Location: [facility];  Service: Plastics;  Laterality: Bilateral;   BREAST SURGERY      FAMILY HISTORY: Family History  Problem Relation Age of Onset   Hypertension Mother    Arthritis Mother    Arrhythmia Father        needed pacemaker   Heart attack Neg Hx     SOCIAL HISTORY: Social History   Socioeconomic History   Marital status: Married    Spouse name: Not on file   Number of children: Not  on file   Years of education: Not on file   Highest education level: Not on file  Occupational History   Not on file  Tobacco Use   Smoking status: Never   Smokeless tobacco: Never  Substance and Sexual Activity   Alcohol use: No   Drug use: No   Sexual activity: Not on file  Other Topics Concern   Not on file  Social History Narrative   Not on file   Social Determinants of Health   Financial Resource Strain: Not on file  Food Insecurity: Not on file  Transportation Needs: Not on file  Physical Activity: Not on file  Stress: Not on file  Social Connections: Not on file  Intimate Partner Violence: Not on file    PHYSICAL EXAM  GENERAL EXAM/CONSTITUTIONAL: Vitals:  Vitals:   04/04/22 1057  BP: 128/82  Pulse: (!) 47  Weight: 223 lb (101.2 kg)  Height: 5\' 6"  (1.676 m)   Body mass index is 35.99 kg/m. Wt Readings from Last 3 Encounters:  04/04/22 223 lb (101.2 kg)  03/19/22 223 lb (101.2 kg)  06/18/21 227 lb 8.2 oz (103.2 kg)   Patient is in no distress; well developed, nourished and groomed; neck is supple  EYES: Pupils round and reactive to light, Visual fields full to confrontation, Extraocular movements intacts,   MUSCULOSKELETAL: Gait, strength, tone, movements noted in Neurologic exam below  NEUROLOGIC: MENTAL STATUS:      No data to display         awake, alert, oriented to person, place and time recent and remote memory intact normal attention and concentration language fluent, comprehension intact, naming intact fund of knowledge appropriate  CRANIAL NERVE:  2nd, 3rd, 4th, 6th - pupils equal and reactive to light, visual fields full to confrontation, extraocular muscles intact, no nystagmus 5th - facial sensation symmetric 7th - facial strength symmetric 8th - hearing intact 9th - palate elevates symmetrically, uvula midline 11th - shoulder shrug symmetric 12th - tongue protrusion midline  MOTOR:  normal bulk and tone, full strength in  the BUE, BLE  SENSORY:  normal and symmetric to light touch, pinprick, temperature, vibration  COORDINATION:  finger-nose-finger, fine finger movements normal  REFLEXES:  deep tendon reflexes present and symmetric  GAIT/STATION:  normal   DIAGNOSTIC DATA (LABS, IMAGING, TESTING) - I reviewed patient records, labs, notes, testing and imaging myself where available.  Lab Results  Component Value Date   WBC 7.0 03/19/2022   HGB 12.6 03/19/2022   HCT 37.0 03/19/2022   MCV 94.4 03/19/2022   PLT 223 03/19/2022      Component Value Date/Time   NA 138 03/19/2022 2310   K 3.2 (L) 03/19/2022 2310   CL 100 03/19/2022 2310   CO2 26 03/19/2022 2231   GLUCOSE 107 (H) 03/19/2022 2310   BUN 19 03/19/2022 2310   CREATININE 1.00  03/19/2022 2310   CALCIUM 9.6 03/19/2022 2231   PROT 7.9 03/19/2022 2231   ALBUMIN 4.3 03/19/2022 2231   AST 22 03/19/2022 2231   ALT 12 03/19/2022 2231   ALKPHOS 72 03/19/2022 2231   BILITOT 0.8 03/19/2022 2231   GFRNONAA >60 03/19/2022 2231   GFRAA >60 01/02/2017 0522   Lab Results  Component Value Date   CHOL 180 01/02/2017   HDL 70 01/02/2017   LDLCALC 104 (H) 01/02/2017   TRIG 31 01/02/2017   CHOLHDL 2.6 01/02/2017   No results found for: "HGBA1C" No results found for: "VITAMINB12" No results found for: "TSH"  Head CT 03/20/22 Negative noncontrast head CT.  CTA Head and Neck 03/20/22 Normal CTA of the head and neck.  Brain MRI 03/20/22 Normal brain MRI.   ASSESSMENT AND PLAN  61 y.o. year old female with history of hypertension, hyperlipidemia who is presenting with complaint of morning headache for the past 2 months.  Patient reports that headaches usually start from the back of the head.  There are no migrainous features.  She does report some tightness of her neck muscles, denies visual obscuration. Patient likely has cervicalgia.  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  I will start  by prescribing tizanidine as muscle relaxant and also refer the patient to physical therapy for the cervicalgia.  She is comfortable with plan.  Advised her to continue following up with her PCP and return in 3 months for follow up.    1. Cervicogenic headache     Patient Instructions  Trial of tizanidine 4 cervicalgia Referral to PT Continue to follow with your PCP Follow-up in 3 months or sooner if worse   Orders Placed This Encounter  Procedures   Ambulatory referral to Physical Therapy    Meds ordered this encounter  Medications   tiZANidine (ZANAFLEX) 4 MG tablet    Sig: Take 1 tablet (4 mg total) by mouth every 8 (eight) hours as needed for muscle spasms.    Dispense:  30 tablet    Refill:  0    Return in about 3 months (around 07/05/2022).  I have spent a total of 45 minutes dedicated to this patient today, preparing to see patient, performing a medically appropriate examination and evaluation, ordering tests and/or medications and procedures, and counseling and educating the patient/family/caregiver; independently interpreting result and communicating results to the family/patient/caregiver; and documenting clinical information in the electronic medical record.   Alric Ran, MD 04/04/2022, 12:22 PM  [facility] [address] [place], [place] [address] [phone]

## 2022-04-14 ENCOUNTER — Ambulatory Visit: Payer: 59 | Attending: Neurology | Admitting: Physical Therapy

## 2022-04-14 ENCOUNTER — Encounter: Payer: Self-pay | Admitting: Physical Therapy

## 2022-04-14 DIAGNOSIS — M542 Cervicalgia: Secondary | ICD-10-CM | POA: Insufficient documentation

## 2022-04-14 DIAGNOSIS — M6281 Muscle weakness (generalized): Secondary | ICD-10-CM | POA: Diagnosis present

## 2022-04-14 DIAGNOSIS — G4486 Cervicogenic headache: Secondary | ICD-10-CM | POA: Diagnosis not present

## 2022-04-14 NOTE — Therapy (Signed)
OUTPATIENT PHYSICAL THERAPY CERVICAL EVALUATION   Patient Name: Claudia Barrett MRN: [id number] DOB:[birth year], 61 y.o., female Today's Date: 04/14/2022   PT End of Session - 04/14/22 1149     Visit Number 1    Number of Visits 7   with eval   Date for PT Re-Evaluation 05/26/22    Authorization Type [employer]    PT Start Time 1150    PT Stop Time 1228    PT Time Calculation (min) 38 min    Activity Tolerance Patient tolerated treatment well    Behavior During Therapy WFL for tasks assessed/performed             Past Medical History:  Diagnosis Date   Dyspnea    HLD (hyperlipidemia)    Hypertension    Sciatica    Past Surgical History:  Procedure Laterality Date   ABDOMINAL HYSTERECTOMY     BREAST REDUCTION SURGERY Bilateral 06/18/2021   Procedure: MAMMARY REDUCTION  (BREAST);  Surgeon: Cindra Presume, MD;  Location: [facility];  Service: Plastics;  Laterality: Bilateral;   BREAST SURGERY     Patient Active Problem List   Diagnosis Date Noted   Chest pain 01/02/2017   HLD (hyperlipidemia)    Hypertension    Sciatica    Breast hypertrophy in female 01/09/2016    PCP: Shirline Frees, MD  REFERRING PROVIDER: Alric Ran, MD   REFERRING DIAG: [phone] (ICD-10-CM) - Cervicogenic headache   THERAPY DIAG:  Cervicalgia  Muscle weakness (generalized)  Rationale for Evaluation and Treatment Rehabilitation  ONSET DATE: 04/04/2022   SUBJECTIVE:                                                                                                                                                                                                         SUBJECTIVE STATEMENT: Pt reports going to ED about 2 weeks ago due to sharp pain going down R side of neck, back of neck and shoulders, and up into her head. She reports she has also been getting headaches intermittently and has stiffness in her neck and shoulders when turning her head. Pt reports these symptoms  have been going on for a month with no specific injuring occurring at onset. When pt presented to the ED she also had severe dizziness, blurred vision, photosensitivity, and a "knife-like" pain down into her head and that she has not experienced symptoms to that degree since that visit, has not been taking meclizine since it was prescribed as she has not needed it.  PERTINENT HISTORY:  hypertension, hyperlipidemia  PAIN:  Are you having pain? No  Are you having pain? Not currently but when she does have pain: Pain location: back of neck, shoulders, headache Pain description: sharp, throbbing, radiating Aggravating factors: driving (turning head) Relieving factors: muscle relaxer and pain medication   PRECAUTIONS: None  WEIGHT BEARING RESTRICTIONS No  FALLS:  Has patient fallen in last 6 months? No  LIVING ENVIRONMENT: Lives with: lives with their spouse Lives in: House/apartment Stairs: Yes: Internal: 4 steps; can reach both and External: 12 steps; on right going up Has following equipment at home: None  OCCUPATION: works for [employer] as a Merchandiser, retail in parking decks, job requires a lot of walking in the mornings, emptying trash, etc.  PLOF: Independent  PATIENT GOALS: get rid of headaches and sharp pains down into the neck and shoulders  OBJECTIVE:   DIAGNOSTIC FINDINGS:  Imaging performed 03/20/2022  IMPRESSION: Negative noncontrast head CT.  IMPRESSION: Normal CTA of the head and neck.  IMPRESSION: Normal brain MRI.   Imaging performed 04/04/2020 IMPRESSION: 1. Mild lower cervical spondylosis.  No acute bony abnormality.  CT  PATIENT SURVEYS:  NDI : 11/50, 22% disability  COGNITION: Overall cognitive status: Within functional limits for tasks assessed  SENSATION: Reports N/T down into fingers (especially into thumbs) that started in 2021  POSTURE: rounded shoulders and forward head  PALPATION: Palpable trigger points in B upper traps and L SCM that  recreate pain, tenderness to the touch   CERVICAL ROM:   Active ROM AROM (deg) eval  Flexion 20 (stiff)  Extension 60 (stiff)  Right lateral flexion 36  Left lateral flexion 30 (painful on R side)  Right rotation 45  Left rotation 55   (Blank rows = not tested)  UPPER EXTREMITY ROM:  Active ROM Right eval Left eval  Shoulder flexion [facility]  Shoulder extension    Shoulder abduction [facility]  Shoulder adduction    Shoulder extension    Shoulder internal rotation    Shoulder external rotation    Elbow flexion WFL WFL  Elbow extension [facility] WFL  Wrist flexion    Wrist extension    Wrist ulnar deviation    Wrist radial deviation    Wrist pronation    Wrist supination     (Blank rows = not tested)  UPPER EXTREMITY MMT:  MMT Right eval Left eval  Shoulder flexion 5 5  Shoulder extension    Shoulder abduction 5 5  Shoulder adduction    Shoulder extension    Shoulder internal rotation 5 5  Shoulder external rotation 5 5  Middle trapezius    Lower trapezius    Elbow flexion 5 5  Elbow extension 5 5  Wrist flexion    Wrist extension    Wrist ulnar deviation    Wrist radial deviation    Wrist pronation    Wrist supination    Grip strength     (Blank rows = not tested)  CERVICAL SPECIAL TESTS:  Neck flexor muscle endurance test: Negative and suboccipital release With suboccipital release pt reports recreation of headache symptoms  TODAY'S TREATMENT:  PT Eval   PATIENT EDUCATION:  Education details: Eval findings, POC Person educated: Patient Education method: Explanation Education comprehension: verbalized understanding   HOME EXERCISE PROGRAM: To be established next session  ASSESSMENT:  CLINICAL IMPRESSION: Patient is a 61 year old female referred to Neuro OPPT for cervicalgia.   Pt's PMH is significant for: hypertension, hyperlipidemia  The following deficits were present during  the exam: decreased cervical ROM (flexion, rotation, lateral  flexion), pain with cervical AROM, tenderness to palpation and palpable trigger points in upper trap and neck musculature, and 22% disability due to score of 11/50 on NDI. Pt would benefit from skilled PT to address these impairments and functional limitations to maximize functional mobility independence.   OBJECTIVE IMPAIRMENTS decreased knowledge of condition, decreased ROM, impaired flexibility, impaired sensation, improper body mechanics, postural dysfunction, and pain.   ACTIVITY LIMITATIONS carrying, lifting, bending, and squatting  PARTICIPATION LIMITATIONS: cleaning, laundry, driving, community activity, occupation, and yard work  PERSONAL FACTORS hypertension, hyperlipidemia  are also affecting patient's functional outcome.   REHAB POTENTIAL: Good  CLINICAL DECISION MAKING: Stable/uncomplicated  EVALUATION COMPLEXITY: Low   GOALS: Goals reviewed with patient? Yes  SHORT TERM GOALS: Target date: 05/05/2022   Initiate HEP Baseline: none established at eval Goal status: INITIAL  2.  Pt will improve score on NDI to 8/50 to show decrease in disability level and ability to complete ADLs in a more functional and pain-free manner. Baseline: 11/50 (04/14/22) Goal status: INITIAL  3.  Pt will increase cervical flexion to >/= 30 degrees to show improvement in ROM and ability to complete daily tasks in more functional range. Baseline: 20 degrees (04/14/22) Goal status: INITIAL  4.  Pt will increase L lateral cervical flexion to >/= 35 degrees to show improvement in ROM and decreased pain with functional head movements. Baseline: 30 degrees (04/14/22) Goal status: INITIAL  5.  Pt will increase R cervical rotation to >/= 55 degrees to show improvement in functional ROM and demonstrate decreased pain with functional movements such as head turns with driving. Baseline: 45 degrees (04/14/22) Goal status: INITIAL   LONG TERM GOALS: Target date: 05/26/2022  Pt will be independent with  ROM and postural stability HEP for improved pain management and improvement in ability to perform ADLs in a more pain-free manner. Baseline:  Goal status: INITIAL  2.  Pt will improve score on NDI to 5/50 to show decrease in disability level and ability to complete ADLs in a more functional and pain-free manner. Baseline: 11/50 (04/14/22) Goal status: INITIAL  3.  Pt will increase cervical flexion to >/= 40 degrees to show improvement in ROM and ability to complete daily tasks in more functional range. Baseline: 20 degrees (04/14/22) Goal status: INITIAL  4.  Pt will increase L lateral cervical flexion to >/= 40 degrees to show improvement in ROM and decreased pain with functional head movements. Baseline: 30 degrees (04/14/22) Goal status: INITIAL  5.  Pt will increase R cervical rotation to >/= 65 degrees to show improvement in functional ROM and demonstrate decreased pain with functional movements such as head turns with driving. Baseline: 45 degrees (04/14/22) Goal status: INITIAL   PLAN: PT FREQUENCY: 1x/week  PT DURATION: 6 weeks  PLANNED INTERVENTIONS: Therapeutic exercises, Therapeutic activity, Neuromuscular re-education, Balance training, Gait training, Patient/Family education, Self Care, Joint mobilization, Dry Needling, Electrical stimulation, Cryotherapy, Moist heat, Taping, Manual therapy, and Re-evaluation  PLAN FOR NEXT SESSION: initiate postural stability and upper trap/cervical stretching HEP, suboccipital release, dry needling, moist hot pack   Peter Congo, PT, DPT, CSRS 04/14/2022, 12:32 PM

## 2022-04-25 ENCOUNTER — Ambulatory Visit: Payer: 59 | Attending: Neurology | Admitting: Physical Therapy

## 2022-04-25 ENCOUNTER — Encounter: Payer: Self-pay | Admitting: Physical Therapy

## 2022-04-25 DIAGNOSIS — M542 Cervicalgia: Secondary | ICD-10-CM | POA: Diagnosis present

## 2022-04-25 DIAGNOSIS — M6281 Muscle weakness (generalized): Secondary | ICD-10-CM | POA: Insufficient documentation

## 2022-04-25 NOTE — Therapy (Signed)
OUTPATIENT PHYSICAL THERAPY CERVICAL TREATMENT   Patient Name: Claudia Barrett MRN: [id number] DOB:10-24-1960, 61 y.o., female Today's Date: 04/25/2022   PT End of Session - 04/25/22 1112     Visit Number 2    Number of Visits 7   with eval   Date for PT Re-Evaluation 05/26/22    Authorization Type [employer]    PT Start Time 1112   pt late   PT Stop Time 1140    PT Time Calculation (min) 28 min    Activity Tolerance Patient tolerated treatment well    Behavior During Therapy WFL for tasks assessed/performed              Past Medical History:  Diagnosis Date   Dyspnea    HLD (hyperlipidemia)    Hypertension    Sciatica    Past Surgical History:  Procedure Laterality Date   ABDOMINAL HYSTERECTOMY     BREAST REDUCTION SURGERY Bilateral 06/18/2021   Procedure: MAMMARY REDUCTION  (BREAST);  Surgeon: Cindra Presume, MD;  Location: [place];  Service: Plastics;  Laterality: Bilateral;   BREAST SURGERY     Patient Active Problem List   Diagnosis Date Noted   Chest pain 01/02/2017   HLD (hyperlipidemia)    Hypertension    Sciatica    Breast hypertrophy in female 01/09/2016    PCP: Shirline Frees, MD  REFERRING PROVIDER: Alric Ran, MD   REFERRING DIAG: [phone] (ICD-10-CM) - Cervicogenic headache   THERAPY DIAG:  Cervicalgia  Muscle weakness (generalized)  Rationale for Evaluation and Treatment Rehabilitation  ONSET DATE: 04/04/2022   SUBJECTIVE:                                                                                                                                                                                                         SUBJECTIVE STATEMENT: Pt reports she has been doing well, symptoms are about the same as last time with no changes. Pt also reports that in Oct 2022 she had breast reduction surgery due to having a lot of neck and back pain. Pt reports she was walking slumped over before her surgery but her pain has been  better since surgery.  PERTINENT HISTORY:  hypertension, hyperlipidemia   PAIN:  Are you having pain? Yes: NPRS scale: 8/10 Pain location: back of neck down in shoulders L>R, shoots down arm into fingertips Pain description: sharp achy pain, tense Aggravating factors: driving Relieving factors: massage  PRECAUTIONS: None  PLOF: Independent  PATIENT GOALS: get rid of headaches and sharp pains down into  the neck and shoulders  OBJECTIVE:   TODAY'S TREATMENT:  MANUAL THERAPY: Subocciptal release x 5 min with some reports of pain relief  THER EX: Initiated HEP:  Exercises  - Supine Chin Tuck with Towel  - 1 x daily - 7 x weekly - 2 sets - 10 reps  - Seated Upper Trapezius Stretch  - 1 x daily - 7 x weekly - 1 sets - 5 reps - 30-60 sec hold - Supine Suboccipital Release with Tennis Balls  - 1 x daily - 7 x weekly - 1 sets - 1 reps - 5 min hold  - Scapular Retraction with Resistance  - 1 x daily - 7 x weekly - 3 sets - 10 reps  *encouraged pt to use heating pad before and after exercises x 10-15 min   PATIENT EDUCATION:  Education details: HEP Person educated: Patient Education method: Explanation, Demonstration, Tactile cues, Verbal cues, and Handouts Education comprehension: verbalized understanding   HOME EXERCISE PROGRAM: Access Code: [email or web address] URL: [email or web address] Date: 04/25/2022 Prepared by: Peter Congo  Exercises - Supine Chin Tuck with Towel  - 1 x daily - 7 x weekly - 2 sets - 10 reps - Seated Upper Trapezius Stretch  - 1 x daily - 7 x weekly - 1 sets - 5 reps - 30-60 sec hold - Supine Suboccipital Release with Tennis Balls  - 1 x daily - 7 x weekly - 1 sets - 1 reps - 5 min hold - Scapular Retraction with Resistance  - 1 x daily - 7 x weekly - 3 sets - 10 reps  ASSESSMENT:  CLINICAL IMPRESSION: Emphasis of skilled PT session on performing suboccipital release for pain relief and initiating HEP with patient. See bolded exercises above  for HEP. Pt instructed in correct technique of exercise performance and able to perform return demonstration, handout provided. Continue POC.   OBJECTIVE IMPAIRMENTS decreased knowledge of condition, decreased ROM, impaired flexibility, impaired sensation, improper body mechanics, postural dysfunction, and pain.   ACTIVITY LIMITATIONS carrying, lifting, bending, and squatting  PARTICIPATION LIMITATIONS: cleaning, laundry, driving, community activity, occupation, and yard work  PERSONAL FACTORS hypertension, hyperlipidemia  are also affecting patient's functional outcome.   REHAB POTENTIAL: Good  CLINICAL DECISION MAKING: Stable/uncomplicated  EVALUATION COMPLEXITY: Low   GOALS: Goals reviewed with patient? Yes  SHORT TERM GOALS: Target date: 05/05/2022   Initiate HEP Baseline: none established at eval Goal status: INITIAL  2.  Pt will improve score on NDI to 8/50 to show decrease in disability level and ability to complete ADLs in a more functional and pain-free manner. Baseline: 11/50 (04/14/22) Goal status: INITIAL  3.  Pt will increase cervical flexion to >/= 30 degrees to show improvement in ROM and ability to complete daily tasks in more functional range. Baseline: 20 degrees (04/14/22) Goal status: INITIAL  4.  Pt will increase L lateral cervical flexion to >/= 35 degrees to show improvement in ROM and decreased pain with functional head movements. Baseline: 30 degrees (04/14/22) Goal status: INITIAL  5.  Pt will increase R cervical rotation to >/= 55 degrees to show improvement in functional ROM and demonstrate decreased pain with functional movements such as head turns with driving. Baseline: 45 degrees (04/14/22) Goal status: INITIAL   LONG TERM GOALS: Target date: 05/26/2022  Pt will be independent with ROM and postural stability HEP for improved pain management and improvement in ability to perform ADLs in a more pain-free manner. Baseline:  Goal status:  INITIAL  2.  Pt will improve score on NDI to 5/50 to show decrease in disability level and ability to complete ADLs in a more functional and pain-free manner. Baseline: 11/50 (04/14/22) Goal status: INITIAL  3.  Pt will increase cervical flexion to >/= 40 degrees to show improvement in ROM and ability to complete daily tasks in more functional range. Baseline: 20 degrees (04/14/22) Goal status: INITIAL  4.  Pt will increase L lateral cervical flexion to >/= 40 degrees to show improvement in ROM and decreased pain with functional head movements. Baseline: 30 degrees (04/14/22) Goal status: INITIAL  5.  Pt will increase R cervical rotation to >/= 65 degrees to show improvement in functional ROM and demonstrate decreased pain with functional movements such as head turns with driving. Baseline: 45 degrees (04/14/22) Goal status: INITIAL   PLAN: PT FREQUENCY: 1x/week  PT DURATION: 6 weeks  PLANNED INTERVENTIONS: Therapeutic exercises, Therapeutic activity, Neuromuscular re-education, Balance training, Gait training, Patient/Family education, Self Care, Joint mobilization, Dry Needling, Electrical stimulation, Cryotherapy, Moist heat, Taping, Manual therapy, and Re-evaluation  PLAN FOR NEXT SESSION: add to postural stability and upper trap/cervical stretching HEP, suboccipital release, dry needling, moist hot pack, SciFit     Peter Congo, PT, DPT, CSRS 04/25/2022, 12:08 PM

## 2022-05-02 ENCOUNTER — Ambulatory Visit: Payer: 59 | Admitting: Physical Therapy

## 2022-05-09 ENCOUNTER — Ambulatory Visit: Payer: 59 | Admitting: Physical Therapy

## 2022-05-16 ENCOUNTER — Ambulatory Visit: Payer: 59 | Attending: Neurology | Admitting: Physical Therapy

## 2022-05-16 DIAGNOSIS — M542 Cervicalgia: Secondary | ICD-10-CM | POA: Diagnosis present

## 2022-05-16 DIAGNOSIS — M6281 Muscle weakness (generalized): Secondary | ICD-10-CM | POA: Insufficient documentation

## 2022-05-16 NOTE — Therapy (Signed)
OUTPATIENT PHYSICAL THERAPY CERVICAL TREATMENT   Patient Name: Claudia Barrett MRN: [id number] DOB:Jan 24, 1961, 61 y.o., female Today's Date: 05/16/2022   PT End of Session - 05/16/22 1116     Visit Number 3    Number of Visits 7   with eval   Date for PT Re-Evaluation 05/26/22    Authorization Type [employer]    PT Start Time 1115   pt not checked in, started session late   PT Stop Time 1155    PT Time Calculation (min) 40 min    Activity Tolerance Patient tolerated treatment well    Behavior During Therapy [facility] for tasks assessed/performed               Past Medical History:  Diagnosis Date   Dyspnea    HLD (hyperlipidemia)    Hypertension    Sciatica    Past Surgical History:  Procedure Laterality Date   ABDOMINAL HYSTERECTOMY     BREAST REDUCTION SURGERY Bilateral 06/18/2021   Procedure: MAMMARY REDUCTION  (BREAST);  Surgeon: Cindra Presume, MD;  Location: [place];  Service: Plastics;  Laterality: Bilateral;   BREAST SURGERY     Patient Active Problem List   Diagnosis Date Noted   Chest pain 01/02/2017   HLD (hyperlipidemia)    Hypertension    Sciatica    Breast hypertrophy in female 01/09/2016    PCP: Shirline Frees, MD  REFERRING PROVIDER: Alric Ran, MD   REFERRING DIAG: [phone] (ICD-10-CM) - Cervicogenic headache   THERAPY DIAG:  Cervicalgia  Muscle weakness (generalized)  Rationale for Evaluation and Treatment Rehabilitation  ONSET DATE: 04/04/2022   SUBJECTIVE:                                                                                                                                                                                                         SUBJECTIVE STATEMENT: Pt reports she has been very busy with work and had a lot of funerals to attend so hasn't been able to come to therapy lately or work on her HEP. 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 Pt reports she was able to get a new desk chair at work and moved her computer so she can sit directly in front of it, reports pain improved after this change. Pt also reports she did try doing chin tucks and they helped  to reduce her pain.   PERTINENT HISTORY:  hypertension, hyperlipidemia   PAIN:  Are you having pain? Yes: NPRS scale: 8/10 Pain location: back of neck down in shoulders L>R, shoots down arm into fingertips Pain description: sharp achy pain, tense Aggravating factors: driving Relieving factors: massage  PRECAUTIONS: None  PLOF: Independent  PATIENT GOALS: get rid of headaches and sharp pains down into the neck and shoulders  OBJECTIVE:   TODAY'S TREATMENT:  MANUAL THERAPY: Applied moist hot-pack to upper back/cervical region x 15 min for muscle relaxation while pt resting in supine position. Performed supine suboccipital release x 5 min with pt reporting some relief of symptoms following stretch. Reviewed how to setup tennis ball to perform stretch at home. Skin inspection performed following use of hot-pack with no adverse effects noted.  THER ACT:     CERVICAL ROM:    Active ROM AROM (deg) eval AROM (deg) 05/16/22  Flexion 20 (stiff) 24  Extension 60 (stiff)   Right lateral flexion 36 40  Left lateral flexion 30 (painful on R side) 30 (painful on R side)  Right rotation 45 50  Left rotation 55 55    NDI: 12/50, 24% disabled  SELF-CARE/HOME MANAGEMENT Extensive education/discussion with patient regarding importance of compliance with HEP in order to see improvements in pain. Discussed tension headaches and origin of her pain/headaches being due to tight musculature in her suboccipital and upper shoulder region as well as muscle weakness. Discussed managing stress in order to better manage pain symptoms. Discussed modifying work space in order to decrease strain on  neck muscles and how to reposition herself while sitting in church to avoid muscle strain. Pt exhibits better understanding of how to avoid painful positions following discussion, will benefit from ongoing education.   PATIENT EDUCATION:  Education details: HEP, see above Person educated: Patient Education method: Explanation, Demonstration, Tactile cues, Verbal cues, and Handouts Education comprehension: verbalized understanding   HOME EXERCISE PROGRAM: Access Code: [email or web address] URL: [email or web address] Date: 04/25/2022 Prepared by: Excell Seltzer  Exercises - Supine Chin Tuck with Towel  - 1 x daily - 7 x weekly - 2 sets - 10 reps - Seated Upper Trapezius Stretch  - 1 x daily - 7 x weekly - 1 sets - 5 reps - 30-60 sec hold - Supine Suboccipital Release with Tennis Balls  - 1 x daily - 7 x weekly - 1 sets - 1 reps - 5 min hold - Scapular Retraction with Resistance  - 1 x daily - 7 x weekly - 3 sets - 10 reps  ASSESSMENT:  CLINICAL IMPRESSION: Emphasis of skilled PT session on assessing STG including reassessing NDI and neck ROM as well as performing manual therapy. NDI score increased from 11/50 to 12/50 indicating an increase in pain disability level. Pt exhibited improved or no change with her cervical ROM and ongoing pain with L lateral cervical flexion. Pt has been non-compliant with her HEP as well as not attending her therapy sessions due to stress in her personal and work life. Session also focused on educating pt about her diagnosis and importance of compliance with HEP to see an improvement in symptoms. Pt has only met 1/5 STG due to this, but is making progress towards increasing her cervical ROM. Pt will benefit from ongoing education and skilled PT services to address ROM, strength, and pain impairments. Continue POC.   OBJECTIVE IMPAIRMENTS decreased knowledge of condition, decreased ROM, impaired flexibility, impaired sensation, improper body mechanics, postural  dysfunction, and pain.  ACTIVITY LIMITATIONS carrying, lifting, bending, and squatting  PARTICIPATION LIMITATIONS: cleaning, laundry, driving, community activity, occupation, and yard work  PERSONAL FACTORS hypertension, hyperlipidemia  are also affecting patient's functional outcome.   REHAB POTENTIAL: Good  CLINICAL DECISION MAKING: Stable/uncomplicated  EVALUATION COMPLEXITY: Low   GOALS: Goals reviewed with patient? Yes  SHORT TERM GOALS: Target date: 05/05/2022   Initiate HEP Baseline: established at 2nd visit Goal status: MET  2.  Pt will improve score on NDI to 8/50 to show decrease in disability level and ability to complete ADLs in a more functional and pain-free manner. Baseline: 11/50 (04/14/22), 12/50 (05/16/22) Goal status: NOT MET  3.  Pt will increase cervical flexion to >/= 30 degrees to show improvement in ROM and ability to complete daily tasks in more functional range. Baseline: 20 degrees (04/14/22), 24 degrees (05/16/22) Goal status: IN PROGRESS  4.  Pt will increase L lateral cervical flexion to >/= 35 degrees to show improvement in ROM and decreased pain with functional head movements. Baseline: 30 degrees (04/14/22), 30 degrees (05/16/22) Goal status: IN PROGRESS  5.  Pt will increase R cervical rotation to >/= 55 degrees to show improvement in functional ROM and demonstrate decreased pain with functional movements such as head turns with driving. Baseline: 45 degrees (04/14/22), 50 degrees (05/16/22) Goal status: IN PROGRESS   LONG TERM GOALS: Target date: 05/26/2022  Pt will be independent with ROM and postural stability HEP for improved pain management and improvement in ability to perform ADLs in a more pain-free manner. Baseline:  Goal status: INITIAL  2.  Pt will improve score on NDI to 5/50 to show decrease in disability level and ability to complete ADLs in a more functional and pain-free manner. Baseline: 11/50 (04/14/22), 12/50 (9/1) Goal status:  INITIAL  3.  Pt will increase cervical flexion to >/= 40 degrees to show improvement in ROM and ability to complete daily tasks in more functional range. Baseline: 20 degrees (04/14/22), 24 degrees (9/1) Goal status: INITIAL  4.  Pt will increase L lateral cervical flexion to >/= 40 degrees to show improvement in ROM and decreased pain with functional head movements. Baseline: 30 degrees (04/14/22), 30 degrees (9/1) Goal status: INITIAL  5.  Pt will increase R cervical rotation to >/= 65 degrees to show improvement in functional ROM and demonstrate decreased pain with functional movements such as head turns with driving. Baseline: 45 degrees (04/14/22), 50 degrees (9/1) Goal status: INITIAL   PLAN: PT FREQUENCY: 1x/week  PT DURATION: 6 weeks  PLANNED INTERVENTIONS: Therapeutic exercises, Therapeutic activity, Neuromuscular re-education, Balance training, Gait training, Patient/Family education, Self Care, Joint mobilization, Dry Needling, Electrical stimulation, Cryotherapy, Moist heat, Taping, Manual therapy, and Re-evaluation  PLAN FOR NEXT SESSION: add to postural stability and upper trap/cervical stretching HEP, suboccipital release, dry needling, moist hot pack, SciFit, add in stretches     Excell Seltzer, PT, DPT, CSRS 05/16/2022, 12:01 PM

## 2022-05-23 ENCOUNTER — Ambulatory Visit: Payer: 59 | Admitting: Physical Therapy

## 2022-05-30 ENCOUNTER — Ambulatory Visit: Payer: 59 | Admitting: Physical Therapy

## 2022-05-30 DIAGNOSIS — M6281 Muscle weakness (generalized): Secondary | ICD-10-CM

## 2022-05-30 DIAGNOSIS — M542 Cervicalgia: Secondary | ICD-10-CM

## 2022-05-30 NOTE — Therapy (Signed)
OUTPATIENT PHYSICAL THERAPY CERVICAL TREATMENT   Patient Name: Claudia Barrett MRN: [id number] DOB:Dec 12, 1960, 61 y.o., female Today's Date: 05/31/2022   PT End of Session - 05/30/22 1107     Visit Number 4    Number of Visits 7   with eval   Date for PT Re-Evaluation 07/11/22   to allow for scheduling conflicts   Authorization Type [employer]    PT Start Time 1105   pt late   PT Stop Time 1145    PT Time Calculation (min) 40 min    Activity Tolerance Patient tolerated treatment well    Behavior During Therapy [facility] for tasks assessed/performed                Past Medical History:  Diagnosis Date   Dyspnea    HLD (hyperlipidemia)    Hypertension    Sciatica    Past Surgical History:  Procedure Laterality Date   ABDOMINAL HYSTERECTOMY     BREAST REDUCTION SURGERY Bilateral 06/18/2021   Procedure: MAMMARY REDUCTION  (BREAST);  Surgeon: Cindra Presume, MD;  Location: [facility];  Service: Plastics;  Laterality: Bilateral;   BREAST SURGERY     Patient Active Problem List   Diagnosis Date Noted   Chest pain 01/02/2017   HLD (hyperlipidemia)    Hypertension    Sciatica    Breast hypertrophy in female 01/09/2016    PCP: Shirline Frees, MD  REFERRING PROVIDER: Alric Ran, MD   REFERRING DIAG: 9092264827 (ICD-10-CM) - Cervicogenic headache   THERAPY DIAG:  Cervicalgia  Muscle weakness (generalized)  Rationale for Evaluation and Treatment Rehabilitation  ONSET DATE: 04/04/2022   SUBJECTIVE:                                                                                                                                                                                                         SUBJECTIVE STATEMENT: Pt reports she is taking "BC powder" every 3 days for really severe pain but has not had much pain since last visit. Pt reports she did have some pain this morning at work because she was sitting at her desk with her head turned for an extended  period of time.   PERTINENT HISTORY:  hypertension, hyperlipidemia   PAIN:  Are you having pain? Yes: NPRS scale: 8/10 Pain location: back of neck down in shoulders L>R, shoots down arm into fingertips Pain description: sharp achy pain, tense Aggravating factors: driving Relieving factors: massage  PRECAUTIONS: None  PLOF: Independent  PATIENT GOALS: get rid of headaches and sharp pains  down into the neck and shoulders  OBJECTIVE:   TODAY'S TREATMENT:   THER ACT:     CERVICAL ROM:    Active ROM AROM (deg) eval AROM (deg) 05/16/22 AROM (deg) 05/30/22  Flexion 20 (stiff) 24 30  Extension 60 (stiff)  30 (stiff)  Right lateral flexion 36 40 40  Left lateral flexion 30 (painful on R side) 30 (painful on R side) 45 (painful on R side)  Right rotation 45 50 50  Left rotation 55 55 60   THER EX: Added to HEP, see bolded below. Exercises performed after use of moist heat pack x 10 min. No adverse effects noted following use of moist heat pack, skin inspection performed.  SELF-CARE/HOME MANAGEMENT Discussed PT POC with patient, pt wanting to finish out her initial 7 visits that were planned for. Pt had difficulty attending all 7 visits within original certification date due to difficulty getting off of work to attend therapy sessions. Again discussed pt changing her work setup/computer/environment to decrease strain on her neck and to avoid putting her neck into rotated positions for extended periods of time as this increases her pain.   PATIENT EDUCATION:  Education details: continue HEP, use of heating pad at home, rearrangement of computer setup at work Person educated: Patient Education method: Consulting civil engineer, Media planner, Corporate treasurer cues, Verbal cues, and Handouts Education comprehension: verbalized understanding   HOME EXERCISE PROGRAM: Access Code: [email or web address] URL: [email or web address] Date: 04/25/2022 Prepared by: Excell Seltzer  Exercises - Supine Chin  Tuck with Towel  - 1 x daily - 7 x weekly - 2 sets - 10 reps - Seated Upper Trapezius Stretch  - 1 x daily - 7 x weekly - 1 sets - 5 reps - 30-60 sec hold - Supine Suboccipital Release with Tennis Balls  - 1 x daily - 7 x weekly - 1 sets - 1 reps - 5 min hold - Scapular Retraction with Resistance  - 1 x daily - 7 x weekly - 3 sets - 10 reps - Supine Thoracic Mobilization Towel Roll Vertical with Arm Stretch  - 1 x daily - 7 x weekly - 2 sets - 10 reps  ASSESSMENT:  CLINICAL IMPRESSION: Emphasis of skilled PT session on assessing pt's compliance with her current HEP, adding to HEP, discussing workplace modifications to decrease pain, and reassessing LTG. Pt has met 2/5 LTG due to being independent with her current HEP and improving her L lateral flexion to 45 degrees from 30 degrees at initial eval. Pt has shown improvement in her L cervical rotation, R cervical rotation, R lateral flexion, and cervical flexion AROM since initial eval but did not improve enough to meet her LTG. Additionally, unable to reassess NDI this session due to time constraints, however pt showed change from 11/50 to 12/50 at last assessment. Pt has also reported an overall reduction in her pain symptoms. However, pt will benefit from a recertification of physical therapy services in order to continue addressing her decreased ROM and decreased ability to perform activities of daily living such as driving, working at a computer, and attending church without an increase in her neck pain and onset of cervical headaches. Continue POC.   OBJECTIVE IMPAIRMENTS decreased knowledge of condition, decreased ROM, impaired flexibility, impaired sensation, improper body mechanics, postural dysfunction, and pain.   ACTIVITY LIMITATIONS carrying, lifting, bending, and squatting  PARTICIPATION LIMITATIONS: cleaning, laundry, driving, community activity, occupation, and yard work  PERSONAL FACTORS hypertension, hyperlipidemia  are also  affecting patient's functional outcome.  REHAB POTENTIAL: Good  CLINICAL DECISION MAKING: Stable/uncomplicated  EVALUATION COMPLEXITY: Low   GOALS: Goals reviewed with patient? Yes  SHORT TERM GOALS: Target date: 05/05/2022   Initiate HEP Baseline: established at 2nd visit Goal status: MET  2.  Pt will improve score on NDI to 8/50 to show decrease in disability level and ability to complete ADLs in a more functional and pain-free manner. Baseline: 11/50 (04/14/22), 12/50 (05/16/22) Goal status: NOT MET  3.  Pt will increase cervical flexion to >/= 30 degrees to show improvement in ROM and ability to complete daily tasks in more functional range. Baseline: 20 degrees (04/14/22), 24 degrees (05/16/22) Goal status: IN PROGRESS  4.  Pt will increase L lateral cervical flexion to >/= 35 degrees to show improvement in ROM and decreased pain with functional head movements. Baseline: 30 degrees (04/14/22), 30 degrees (05/16/22) Goal status: IN PROGRESS  5.  Pt will increase R cervical rotation to >/= 55 degrees to show improvement in functional ROM and demonstrate decreased pain with functional movements such as head turns with driving. Baseline: 45 degrees (04/14/22), 50 degrees (05/16/22) Goal status: IN PROGRESS   LONG TERM GOALS: Target date: 05/26/2022  Pt will be independent with ROM and postural stability HEP for improved pain management and improvement in ability to perform ADLs in a more pain-free manner. Baseline:  Goal status: MET  2.  Pt will improve score on NDI to 5/50 to show decrease in disability level and ability to complete ADLs in a more functional and pain-free manner. Baseline: 11/50 (04/14/22), 12/50 (9/1) Goal status: IN PROGRESS  3.  Pt will increase cervical flexion to >/= 40 degrees to show improvement in ROM and ability to complete daily tasks in more functional range. Baseline: 20 degrees (04/14/22), 24 degrees (9/1), 30 degrees (9/15) Goal status: IN  PROGRESS  4.  Pt will increase L lateral cervical flexion to >/= 40 degrees to show improvement in ROM and decreased pain with functional head movements. Baseline: 30 degrees (04/14/22), 30 degrees (9/1), 45 degrees (9/15) Goal status: MET  5.  Pt will increase R cervical rotation to >/= 65 degrees to show improvement in functional ROM and demonstrate decreased pain with functional movements such as head turns with driving. Baseline: 45 degrees (04/14/22), 50 degrees (9/1), 50 degrees (9/15) Goal status: IN PROGRESS   NEW LONG-TERM GOALS: Target date 07/11/2022  1.  Pt will improve score on NDI to 5/50 to show decrease in disability level and ability to complete ADLs in a more functional and pain-free manner. Baseline: 11/50 (04/14/22), 12/50 (9/1) Goal status: IN PROGRESS  2.  Pt will increase cervical flexion to >/= 40 degrees to show improvement in ROM and ability to complete daily tasks in more functional range. Baseline: 20 degrees (04/14/22), 24 degrees (9/1), 30 degrees (9/15) Goal status: IN PROGRESS  3.  Pt will increase R cervical rotation to >/= 65 degrees to show improvement in functional ROM and demonstrate decreased pain with functional movements such as head turns with driving. Baseline: 45 degrees (04/14/22), 50 degrees (9/1), 50 degrees (9/15) Goal status: IN PROGRESS  4. Pt will report no more than 3/10 pain with cervical ROM   Baseline: 7/10   Goal status: INITIAL   PLAN: PT FREQUENCY: 1x/week  PT DURATION: 6 weeks+ 6 weeks (recert)  PLANNED INTERVENTIONS: Therapeutic exercises, Therapeutic activity, Neuromuscular re-education, Balance training, Gait training, Patient/Family education, Self Care, Joint mobilization, Dry Needling, Electrical stimulation, Cryotherapy, Moist heat, Taping, Manual therapy, and Re-evaluation  PLAN FOR NEXT SESSION: add to postural stability and upper trap/cervical stretching HEP, suboccipital release, dry needling, moist hot pack,  SciFit     Excell Seltzer, PT, DPT, CSRS 05/31/2022, 4:44 PM

## 2022-06-13 ENCOUNTER — Ambulatory Visit: Payer: 59 | Admitting: Physical Therapy

## 2022-06-20 ENCOUNTER — Ambulatory Visit: Payer: 59 | Admitting: Physical Therapy

## 2022-06-20 ENCOUNTER — Encounter: Payer: Self-pay | Admitting: Physical Therapy

## 2022-06-20 NOTE — Therapy (Unsigned)
[facility] [address] [place], [place], [address] Phone: [phone]   Fax:  [phone]  Patient Details  Name: Claudia Barrett MRN: [id number] Date of Birth: 10-09-1960 Referring Provider:  No ref. provider found  Encounter Date: 06/20/2022  PHYSICAL THERAPY DISCHARGE SUMMARY  Visits from Start of Care: 4  Current functional level related to goals / functional outcomes: Mod I   Remaining deficits: N/A   Education / Equipment: HEP handout provided during previous sessions   Patient agrees to discharge. Patient goals were met. Patient is being discharged due to being pleased with the current functional level.     Excell Seltzer, PT, DPT, CSRS 06/20/2022, 11:59 AM  [facility] [address] [place], [place], [address] Phone: [phone]   Fax:  [phone]

## 2022-07-11 ENCOUNTER — Ambulatory Visit: Payer: Self-pay | Admitting: Physical Therapy

## 2023-03-31 ENCOUNTER — Other Ambulatory Visit: Payer: Self-pay

## 2023-03-31 ENCOUNTER — Emergency Department (HOSPITAL_COMMUNITY): Payer: 59

## 2023-03-31 ENCOUNTER — Emergency Department (HOSPITAL_COMMUNITY)
Admission: EM | Admit: 2023-03-31 | Discharge: 2023-03-31 | Disposition: A | Discharge status: Home / Self Care | Payer: 59 | Attending: Emergency Medicine | Admitting: Emergency Medicine

## 2023-03-31 ENCOUNTER — Encounter (HOSPITAL_COMMUNITY): Payer: Self-pay

## 2023-03-31 DIAGNOSIS — S39011A Strain of muscle, fascia and tendon of abdomen, initial encounter: Secondary | ICD-10-CM | POA: Diagnosis not present

## 2023-03-31 DIAGNOSIS — I10 Essential (primary) hypertension: Secondary | ICD-10-CM | POA: Diagnosis not present

## 2023-03-31 DIAGNOSIS — T148XXA Other injury of unspecified body region, initial encounter: Secondary | ICD-10-CM

## 2023-03-31 DIAGNOSIS — Z79899 Other long term (current) drug therapy: Secondary | ICD-10-CM | POA: Insufficient documentation

## 2023-03-31 DIAGNOSIS — X58XXXA Exposure to other specified factors, initial encounter: Secondary | ICD-10-CM | POA: Diagnosis not present

## 2023-03-31 DIAGNOSIS — R109 Unspecified abdominal pain: Secondary | ICD-10-CM | POA: Diagnosis present

## 2023-03-31 LAB — CBC
HCT: 35.1 % — ABNORMAL LOW (ref 36.0–46.0)
Hemoglobin: 11.2 g/dL — ABNORMAL LOW (ref 12.0–15.0)
MCH: 30.4 pg (ref 26.0–34.0)
MCHC: 31.9 g/dL (ref 30.0–36.0)
MCV: 95.1 fL (ref 80.0–100.0)
Platelets: 217 10*3/uL (ref 150–400)
RBC: 3.69 MIL/uL — ABNORMAL LOW (ref 3.87–5.11)
RDW: 12 % (ref 11.5–15.5)
WBC: 5.3 10*3/uL (ref 4.0–10.5)
nRBC: 0 % (ref 0.0–0.2)

## 2023-03-31 LAB — URINALYSIS, ROUTINE W REFLEX MICROSCOPIC
Bilirubin Urine: NEGATIVE
Glucose, UA: NEGATIVE mg/dL
Hgb urine dipstick: NEGATIVE
Ketones, ur: NEGATIVE mg/dL
Leukocytes,Ua: NEGATIVE
Nitrite: NEGATIVE
Protein, ur: NEGATIVE mg/dL
Specific Gravity, Urine: 1.008 (ref 1.005–1.030)
pH: 5 (ref 5.0–8.0)

## 2023-03-31 LAB — BASIC METABOLIC PANEL
Anion gap: 9 (ref 5–15)
BUN: 14 mg/dL (ref 8–23)
CO2: 26 mmol/L (ref 22–32)
Calcium: 9.2 mg/dL (ref 8.9–10.3)
Chloride: 102 mmol/L (ref 98–111)
Creatinine, Ser: 0.95 mg/dL (ref 0.44–1.00)
GFR, Estimated: >60 mL/min (ref >60)
Glucose, Bld: 84 mg/dL (ref 70–99)
Potassium: 3.7 mmol/L (ref 3.5–5.1)
Sodium: 137 mmol/L (ref 135–145)

## 2023-03-31 LAB — HEPATIC FUNCTION PANEL
ALT: 11 U/L (ref 0–44)
AST: 19 U/L (ref 15–41)
Albumin: 3.9 g/dL (ref 3.5–5.0)
Alkaline Phosphatase: 52 U/L (ref 38–126)
Bilirubin, Direct: <0.1 mg/dL (ref 0.0–0.2)
Total Bilirubin: 0.6 mg/dL (ref 0.3–1.2)
Total Protein: 7.4 g/dL (ref 6.5–8.1)

## 2023-03-31 LAB — LIPASE, BLOOD: Lipase: 32 U/L (ref 11–51)

## 2023-03-31 MED ORDER — DIAZEPAM 5 MG PO TABS
5.0000 mg | ORAL_TABLET | Freq: Once | ORAL | Status: AC
  Administered 2023-03-31: 5 mg via ORAL
  Filled 2023-03-31: qty 1

## 2023-03-31 MED ORDER — CYCLOBENZAPRINE HCL 10 MG PO TABS: 10.0000 mg | ORAL_TABLET | Freq: Three times a day (TID) | ORAL | 0 refills | Status: AC

## 2023-03-31 MED ORDER — NAPROXEN 250 MG PO TABS
500.0000 mg | ORAL_TABLET | Freq: Once | ORAL | Status: AC
  Administered 2023-03-31: 500 mg via ORAL
  Filled 2023-03-31: qty 2

## 2023-03-31 MED ORDER — NAPROXEN 375 MG PO TABS: 375.0000 mg | ORAL_TABLET | Freq: Two times a day (BID) | ORAL | 0 refills | Status: AC

## 2023-03-31 NOTE — Discharge Instructions (Addendum)
I have prescribed a short course of muscle relaxers to help with your likely muscle strain, please take 1 tablet up to 3 times a day for the next 7 days.  Please be aware this medication can make you drowsy, do not drink alcohol or drive while taking this medication.  In addition, you were given a short course of anti-inflammatories, please take 1 tablet twice a day with food for the next 7 days.  You may also benefit from a heating pad, ice to the right side of your body.

## 2023-03-31 NOTE — ED Provider Notes (Signed)
[facility] EMERGENCY DEPARTMENT AT [facility] Provider Note   CSN: [id number] Arrival date & time: 03/31/23  1159     History HTN, Sciatica No chief complaint on file.   Claudia Barrett is a 62 y.o. female.  62 y.o female with a PMH of HTN, Sciatica presents to the ED with a chief complaint of right flank pain x 2 days. Patient describe this as a sharp cramping sensation to the right flank radiating to the right side, exacerbated with rotation and movement. She has tried [employer], aleve without much improvement in symptoms. She is employed and does some lifting but nothing too heavy. No exacerbating factors aside from palpation. No fever, no nausea or vomiting. No urinary symptoms. No cough or fever.   The history is provided by the patient.       Home Medications Prior to Admission medications   Medication Sig Start Date End Date Taking? Authorizing Provider  cyclobenzaprine (FLEXERIL) 10 MG tablet Take 1 tablet (10 mg total) by mouth 3 (three) times daily for 7 days. 03/31/23 04/07/23 Yes Bobbye Petti, Leonie Douglas, PA-C  naproxen (NAPROSYN) 375 MG tablet Take 1 tablet (375 mg total) by mouth 2 (two) times daily for 7 days. 03/31/23 04/07/23 Yes Placida Cambre, PA-C  amLODipine (NORVASC) 10 MG tablet Take 1 tablet (10 mg total) by mouth daily. 01/02/17   Leroy Sea, MD  chlorthalidone (HYGROTON) 25 MG tablet Take 25 mg by mouth as needed. swelling 03/06/21   [provider]  levocetirizine (XYZAL) 5 MG tablet as needed. Allergies, sinus 05/25/21   [provider]  meclizine (ANTIVERT) 25 MG tablet Take 1 tablet (25 mg total) by mouth 3 (three) times daily as needed for dizziness. 03/20/22   Pollyann Savoy, MD  Naproxen Sodium (ALEVE PO) Take by mouth as needed. Sciatic nerve pain    [provider]  pantoprazole (PROTONIX) 40 MG tablet Take 40 mg by mouth daily. 05/26/21   [provider]  rosuvastatin (CRESTOR) 10 MG tablet Take 10 mg by mouth  daily. 05/26/21   [provider]  tiZANidine (ZANAFLEX) 4 MG tablet Take 1 tablet (4 mg total) by mouth every 8 (eight) hours as needed for muscle spasms. 04/04/22   Windell Norfolk, MD      Allergies    Codeine and Elemental sulfur    Review of Systems   Review of Systems  Constitutional:  Negative for chills and fever.  HENT:  Negative for sore throat.   Respiratory:  Negative for shortness of breath.   Cardiovascular:  Negative for chest pain.  Gastrointestinal:  Negative for abdominal pain, nausea and vomiting.  Genitourinary:  Positive for flank pain.  Musculoskeletal:  Positive for back pain and myalgias.  Skin:  Negative for pallor and wound.  All other systems reviewed and are negative.   Physical Exam Updated Vital Signs BP (!) 166/92 (BP Location: Right Arm)   Pulse 64   Temp 98 F (36.7 C) (Oral)   Resp 15   Wt 100.2 kg   SpO2 100%   BMI 35.67 kg/m  Physical Exam Vitals and nursing note reviewed.  Constitutional:      Appearance: Normal appearance.  HENT:     Head: Normocephalic and atraumatic.     Mouth/Throat:     Mouth: Mucous membranes are moist.  Eyes:     Pupils: Pupils are equal, round, and reactive to light.  Cardiovascular:     Rate and Rhythm: Normal rate.  Pulmonary:     Effort: Pulmonary effort is normal.     Breath sounds: No wheezing or rales.  Abdominal:     General: Abdomen is flat.     Palpations: Abdomen is soft.     Tenderness: There is no abdominal tenderness. There is right CVA tenderness.     Comments: No abdominal tenderness, bowel sounds are present.   Musculoskeletal:     Cervical back: Normal range of motion and neck supple.  Skin:    General: Skin is warm and dry.  Neurological:     Mental Status: She is alert and oriented to person, place, and time.     ED Results / Procedures / Treatments   Labs (all labs ordered are listed, but only abnormal results are displayed) Labs Reviewed  CBC - Abnormal; Notable for  the following components:      Result Value   RBC 3.69 (*)    Hemoglobin 11.2 (*)    HCT 35.1 (*)    All other components within normal limits  URINALYSIS, ROUTINE W REFLEX MICROSCOPIC  BASIC METABOLIC PANEL  HEPATIC FUNCTION PANEL  LIPASE, BLOOD    EKG None  Radiology CT Renal Stone Study  Result Date: 03/31/2023 CLINICAL DATA:  Acute right flank pain. EXAM: CT ABDOMEN AND PELVIS WITHOUT CONTRAST TECHNIQUE: Multidetector CT imaging of the abdomen and pelvis was performed following the standard protocol without IV contrast. RADIATION DOSE REDUCTION: This exam was performed according to the departmental dose-optimization program which includes automated exposure control, adjustment of the mA and/or kV according to patient size and/or use of iterative reconstruction technique. COMPARISON:  None Available. FINDINGS: Lower chest: No acute abnormality. Hepatobiliary: No focal liver abnormality is seen. No gallstones, gallbladder wall thickening, or biliary dilatation. Pancreas: Unremarkable. No pancreatic ductal dilatation or surrounding inflammatory changes. Spleen: Normal in size without focal abnormality. Adrenals/Urinary Tract: Adrenal glands are unremarkable. Kidneys are normal, without renal calculi, focal lesion, or hydronephrosis. Bladder is unremarkable. Stomach/Bowel: Stomach is within normal limits. Appendix appears normal. No evidence of bowel wall thickening, distention, or inflammatory changes. Vascular/Lymphatic: No significant vascular findings are present. No enlarged abdominal or pelvic lymph nodes. Reproductive: Status post hysterectomy. No adnexal masses. Other: No abdominal wall hernia or abnormality. No abdominopelvic ascites. Musculoskeletal: No acute or significant osseous findings. IMPRESSION: No definite abnormality seen in the abdomen or pelvis. Electronically Signed   By: Lupita Raider M.D.   On: 03/31/2023 15:28    Procedures Procedures    Medications Ordered in  ED Medications  naproxen (NAPROSYN) tablet 500 mg (500 mg Oral Given 03/31/23 1550)  diazepam (VALIUM) tablet 5 mg (5 mg Oral Given 03/31/23 1550)    ED Course/ Medical Decision Making/ A&P                             Medical Decision Making Amount and/or Complexity of Data Reviewed Labs: ordered.  Risk Prescription drug management.   This patient presents to the ED for concern of right flank pain, this involves a number of treatment options, and is a complaint that carries with it a high risk of complications and morbidity.  The differential diagnosis includes renal colic, cholelithiasis, msk, versus pneumonia   Co morbidities: Discussed in HPI   Brief History:  See HPI.   EMR reviewed including pt PMHx, past surgical history and past visits to ER.   See HPI for more details   Lab Tests:  I  ordered and independently interpreted labs.  The pertinent results include:    CBC with no leukocytosis, hemoglobin is within normal limits. BMP is without any electrolyte derangement. UA without any nitrites, leukocytes, or hemoglobin to be concern for kidney stone.    Imaging Studies:  CT Renal stone study showed: No definite abnormality seen in the abdomen or pelvis   Cardiac Monitoring:  N/A  Medicines ordered:  I ordered medication including valium, naproxen  for pain control Reevaluation of the patient after these medicines showed that the patient improved I have reviewed the patients home medicines and have made adjustments as needed  Reevaluation:  After the interventions noted above I re-evaluated patient and found that they have :improved   Social Determinants of Health:  The patient's social determinants of health were a factor in the care of this patient    Problem List / ED Course:  Patient presents to the ED with a chief complaint of right flank pain which has been ongoing for the past 2 days, currently employed a paper company, 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  Exacerbated with any kind of rotation.  On evaluation there is tenderness to palpation along the right CVA does not radiate to her abdomen.  No nausea, no vomiting, did take some BC powders without any improvement in symptoms.  Labs here with no leukocytosis, hemoglobin stable.  BMP with no electrolyte derangement, creatinine level is normal.  UA without any signs of hemoglobin, no signs of infection, I have a low suspicion for nephrolithiasis at this time versus renal colic.  She was given Valium, naproxen to help with likely MSK pathology.  Cannot rule out gallbladder etiology, therefore added hepatic function.  Patient also tells me that she just got started on a weight loss medication which does target her pancreas, although pain is mostly on the right side, will obtain lipase level to rule out any pancreatitis although I have a low suspicion for this. CT renal did not show any acute findings to the abdomen, no signs of nephrolithiasis, no acute signs to account for patient's symptoms.  Lipase level is within normal limits, I do not suspect pancreatitis at this time.  No concern for cholecystitis.  We discussed symptomatic treatment for MSK etiology with a short course of muscle relaxers along with anti-inflammatories.  Patient is agreeable to plan and treatment.  Hemodynamically stable for discharge.  Dispostion:  After consideration of the diagnostic results and the patients response to treatment, I feel that the patent would benefit from pain control, follow up with PCP.     Portions of this note were generated with Scientist, clinical (histocompatibility and immunogenetics). Dictation errors may occur despite best attempts at proofreading.   Final Clinical Impression(s) / ED Diagnoses Final diagnoses:  Muscle strain    Rx / DC Orders ED Discharge Orders          Ordered    naproxen (NAPROSYN) 375 MG tablet  2 times daily        03/31/23 1800    cyclobenzaprine (FLEXERIL) 10 MG tablet  3 times daily         03/31/23 1800              Claude Manges, PA-C 03/31/23 1800    Rondel Baton, MD 04/01/23 [phone]

## 2023-03-31 NOTE — ED Provider Triage Note (Signed)
Emergency Medicine Provider Triage Evaluation Note  Claudia Barrett , a 62 y.o. female  was evaluated in triage.  Pt complains of flank pain.  Onset 2 days ago.  Worse with standing.  Patient states she has pain in her flank that radiates around the front.  It "takes my breath away."  She is never had anything like this before.  She has noticed dark urine..  Review of Systems  Positive: Pain Negative: Fever  Physical Exam  BP (!) 160/89   Pulse 71   Temp 98.5 F (36.9 C) (Oral)   Resp 18   Wt 100.2 kg   SpO2 99%   BMI 35.67 kg/m  Gen:   Awake, no distress   Resp:  Normal effort  MSK:   Moves extremities without difficulty  Other:  No ttp of the lumbar spine   Medical Decision Making  Medically screening exam initiated at 1:20 PM.  Appropriate orders placed.  Claudia Barrett was informed that the remainder of the evaluation will be completed by another provider, this initial triage assessment does not replace that evaluation, and the importance of remaining in the ED until their evaluation is complete.    Arthor Captain, PA-C 03/31/23 1321

## 2023-03-31 NOTE — ED Triage Notes (Signed)
Pt c/o R flank since waking Sunday; pain with ambulation; took [facility] PTA with some relief; endorses dark urine; denies N/V

## 2023-04-22 ENCOUNTER — Other Ambulatory Visit (HOSPITAL_COMMUNITY): Payer: Self-pay

## 2023-04-22 MED ORDER — SEMAGLUTIDE-WEIGHT MANAGEMENT 0.5 MG/0.5ML ~~LOC~~ SOAJ
0.5000 mg | SUBCUTANEOUS | 5 refills | Status: AC
  Filled 2023-04-22: qty 2, 28d supply, fill #0
  Filled 2023-05-13: qty 2, 28d supply, fill #1

## 2023-04-23 ENCOUNTER — Ambulatory Visit: Payer: 59 | Admitting: Plastic Surgery

## 2023-04-23 ENCOUNTER — Encounter: Payer: Self-pay | Admitting: Plastic Surgery

## 2023-04-23 ENCOUNTER — Other Ambulatory Visit (HOSPITAL_COMMUNITY): Payer: Self-pay

## 2023-04-23 VITALS — BP 148/87 | HR 91 | Ht 66.0 in | Wt 224.6 lb

## 2023-04-23 DIAGNOSIS — L988 Other specified disorders of the skin and subcutaneous tissue: Secondary | ICD-10-CM

## 2023-04-23 NOTE — Progress Notes (Signed)
Referring Provider Claudia Retort, MD [phone] [address] [place] [address] [place],  [place] [address]   CC:  Chief Complaint  Patient presents with   Consult      Claudia Barrett is an 62 y.o. female.  HPI: [name] presents today with a request for evaluation of forehead rhytids.  She notes that she has deep wrinkles at the lateral borders of the frontalis muscle and also has glabellar wrinkling with contraction of the procerus and corrugators  Allergies  Allergen Reactions   Codeine Other (See Comments)    Extreme fatigue   Elemental Sulfur Nausea And Vomiting    Outpatient Encounter Medications as of 04/23/2023  Medication Sig   amLODipine (NORVASC) 10 MG tablet Take 1 tablet (10 mg total) by mouth daily.   chlorthalidone (HYGROTON) 25 MG tablet Take 25 mg by mouth as needed. swelling   levocetirizine (XYZAL) 5 MG tablet as needed. Allergies, sinus   meclizine (ANTIVERT) 25 MG tablet Take 1 tablet (25 mg total) by mouth 3 (three) times daily as needed for dizziness.   Naproxen Sodium (ALEVE PO) Take by mouth as needed. Sciatic nerve pain   pantoprazole (PROTONIX) 40 MG tablet Take 40 mg by mouth daily.   rosuvastatin (CRESTOR) 10 MG tablet Take 10 mg by mouth daily.   Semaglutide-Weight Management 0.5 MG/0.5ML SOAJ Inject 0.5 mg into the skin once a week.   tiZANidine (ZANAFLEX) 4 MG tablet Take 1 tablet (4 mg total) by mouth every 8 (eight) hours as needed for muscle spasms.   No facility-administered encounter medications on file as of 04/23/2023.     Past Medical History:  Diagnosis Date   Dyspnea    HLD (hyperlipidemia)    Hypertension    Sciatica     Past Surgical History:  Procedure Laterality Date   ABDOMINAL HYSTERECTOMY     BREAST REDUCTION SURGERY Bilateral 06/18/2021   Procedure: MAMMARY REDUCTION  (BREAST);  Surgeon: Allena Napoleon, MD;  Location: [facility];  Service: Plastics;  Laterality: Bilateral;   BREAST SURGERY      Family History   Problem Relation Age of Onset   Hypertension Mother    Arthritis Mother    Arrhythmia Father        needed pacemaker   Heart attack Neg Hx     Social History   Social History Narrative   Not on file     Review of Systems General: Denies fevers, chills, weight loss CV: Denies chest pain, shortness of breath, palpitations Forehead: Rhytids especially with animation  Physical Exam    04/23/2023    9:01 AM 03/31/2023    6:05 PM 03/31/2023    3:26 PM  Vitals with BMI  Height 5\' 6"     Weight 224 lbs 10 oz    BMI 36.27    Systolic 148 168 409  Diastolic 87 78 92  Pulse 91 65 64    General:  No acute distress,  Alert and oriented, Non-Toxic, Normal speech and affect Forehead: Patient has rhytids especially deep along the lateral border of the frontalis on the right and with animation of the procerus and corrugators. Mammogram: Not applicable Assessment/Plan Forehead rhytids: Patient would be a good candidate for Botox.  We discussed the use of Botox on the forehead and in the glabellar region.  She specifically asked about length of action which would be 4 to 6 months generally though it may be shorter with her due to the profound animation.  In repose she has no wrinkles but with animation she has deep rhytids.  I do not think that she would necessarily benefit from fillers at this time.  She understands that the Botox injections may cause bruising and that there is always the risk of brow ptosis.  She would like to proceed.  She will schedule and will be given the billing information today.  Claudia Barrett 04/23/2023, 9:57 AM

## 2023-05-13 ENCOUNTER — Ambulatory Visit (INDEPENDENT_AMBULATORY_CARE_PROVIDER_SITE_OTHER): Payer: Self-pay | Admitting: Surgical

## 2023-05-13 ENCOUNTER — Other Ambulatory Visit (HOSPITAL_COMMUNITY): Payer: Self-pay

## 2023-05-13 DIAGNOSIS — L988 Other specified disorders of the skin and subcutaneous tissue: Secondary | ICD-10-CM

## 2023-05-13 NOTE — Progress Notes (Signed)
Botulinum Toxin Procedure Note  Procedure: Cosmetic botulinum toxin  Pre-operative Diagnosis: Dynamic rhytides  Post-operative Diagnosis: Same  Complications:  None  Brief history: The patient desires botulinum toxin injection.  She is aware of the risks including bleeding, damage to deeper structures, asymmetry, brow ptosis, eyelid ptosis, bruising. The patient understands and wishes to proceed.  Procedure: The area was prepped with alcohol and dried with a clean gauze.  Using a clean technique the botulinum toxin was diluted with 2.5 mL of bacteriostatic saline per 100 unit vial which resulted in 4 units per 0.1 mL.  Subsequently the mixture was injected in the glabellar, lateral canthal lines, forehead area with preservation of the temporal branch to the lateral eyebrow. A total of 24 Units of botulinum toxin was used. The forehead and glabellar area was injected with care to inject intramuscular only while holding pressure on the supratrochlear vessels in each area during each injection on either side of the medial corrugators. The injection proceeded vertically superiorly to the medial 2/3 of the frontalis muscle and superior 2/3 of the lateral frontalis, again with preservation of the frontal branch.  No complications were noted. Light pressure was held for 5 minutes. She was instructed explicitly in post-operative care.  Pictures were obtained of the patient and placed in the chart with the patient's or guardian's permission.   Botox LOT:  [id number] EXP:  05/2025

## 2023-05-21 ENCOUNTER — Ambulatory Visit (INDEPENDENT_AMBULATORY_CARE_PROVIDER_SITE_OTHER): Payer: Self-pay | Admitting: Surgical

## 2023-05-21 DIAGNOSIS — L988 Other specified disorders of the skin and subcutaneous tissue: Secondary | ICD-10-CM

## 2023-05-21 NOTE — Progress Notes (Signed)
Botulinum Toxin Procedure Note  Procedure: Cosmetic botulinum toxin  Pre-operative Diagnosis: Dynamic rhytides  Post-operative Diagnosis: Same  Complications:  None  Brief history: The patient desires botulinum toxin injection.  She is aware of the risks including bleeding, damage to deeper structures, asymmetry, brow ptosis, eyelid ptosis, bruising. The patient understands and wishes to proceed.  Patient had Botox injection on 05/13/2023, she is 8 days post injection, she reports she is very happy with the results, however she has a scar/indentation of the right forehead about 2 cm above her eyebrow.  She feels as if she has had this for quite some time, but per patient she was told by a different provider that it was a wrinkle that was visualized on x-ray.  She is very bothered by this indentation and denies any history of trauma or injury to the area.  I discussed with the patient that I do not feel as if it is a wrinkle, I can feel a indentation in this area at rest and I do not feel as if Botox will particularly fix the area.  We did discuss that the indentation does worsen with raising of her lateral to medial brow on the right side, using Botox in the lateral brow may provide some improvement in the depth of the indentation with activation of the facial forehead muscles.  We did discuss that injecting Botox in this area does increase the risk of brow ptosis, she was willing to proceed with injection at a very low dose.  We discussed injecting 1 unit in each lateral eyebrow area about 1 cm above the eyebrow.  Patient was agreeable to this and she would like to proceed.  Procedure: The area was prepped with alcohol and dried with a clean gauze.  Using a clean technique the botulinum toxin was diluted with 2.5 mL of bacteriostatic saline per 100 unit vial which resulted in 4 units per 0.1 mL.  Subsequently the mixture was injected in the forehead area (laterally 1cm above the eyebrow) A total  of 2 Units of botulinum toxin was used. The forehead area was injected with care to inject intramuscular only while holding pressure on the supratrochlear vessels in each area during each injection on either side of the medial corrugators.   No complications were noted. She was instructed explicitly in post-operative care.  Botox LOT:  [id number] EXP:  2026

## 2023-06-01 ENCOUNTER — Other Ambulatory Visit (HOSPITAL_COMMUNITY): Payer: Self-pay

## 2023-06-24 ENCOUNTER — Ambulatory Visit (INDEPENDENT_AMBULATORY_CARE_PROVIDER_SITE_OTHER): Payer: Self-pay | Admitting: Surgical

## 2023-06-24 DIAGNOSIS — L988 Other specified disorders of the skin and subcutaneous tissue: Secondary | ICD-10-CM

## 2023-06-24 NOTE — Progress Notes (Signed)
Botulinum Toxin Procedure Note  Procedure: Cosmetic botulinum toxin  Pre-operative Diagnosis: Dynamic rhytides  Post-operative Diagnosis: Same  Complications:  None  Brief history: The patient desires botulinum toxin injection.  She is aware of the risks including bleeding, damage to deeper structures, asymmetry, brow ptosis, eyelid ptosis, bruising. The patient understands and wishes to proceed.  Patient had her initial Botox injection on 05/13/2023.  She then had a touchup on 05/21/2023.  She was overall very happy at that time, but she presents today reporting that she would like some additional Botox in her forehead.  She has a scar on her right forehead that bothers her a lot and she feels as if additional Botox will help that.  We discussed today that the Botox will not help with the scar, but I do appreciate some lateral canthal movement and she requests injection there.  She also requests additional glabella injection.  I think this is reasonable given her initial dose was only 24 units.  Procedure: The area was prepped with alcohol and dried with a clean gauze.  Using a clean technique the botulinum toxin was diluted with 2.5 mL of bacteriostatic saline per 100 unit vial which resulted in 4 units per 0.1 mL.  Subsequently the mixture was injected in the glabellar, lateral canthal lines. A total of 16 Units of botulinum toxin was used. The forehead and glabellar area was injected with care to inject intramuscular only while holding pressure on the supratrochlear vessels in each area during each injection on either side of the medial corrugators. The injection proceeded vertically superiorly to the medial 2/3 of the frontalis muscle and superior 2/3 of the lateral frontalis, again with preservation of the frontal branch.  No complications were noted. Light pressure was held for 5 minutes. She was instructed explicitly in post-operative care.  Botox LOT:  [id number] EXP:  2026/09

## 2023-10-29 ENCOUNTER — Ambulatory Visit (INDEPENDENT_AMBULATORY_CARE_PROVIDER_SITE_OTHER): Payer: Self-pay | Admitting: Surgical

## 2023-10-29 VITALS — BP 132/76 | HR 86

## 2023-10-29 DIAGNOSIS — L988 Other specified disorders of the skin and subcutaneous tissue: Secondary | ICD-10-CM

## 2023-10-29 NOTE — Progress Notes (Signed)
Botulinum Toxin Procedure Note  Procedure: Cosmetic botulinum toxin  Pre-operative Diagnosis: Dynamic rhytides  Post-operative Diagnosis: Same  Complications:  None  Brief history: The patient desires botulinum toxin injection.  She is aware of the risks including bleeding, damage to deeper structures, asymmetry, brow ptosis, eyelid ptosis, bruising. The patient understands and wishes to proceed.  Procedure: The area was prepped with alcohol and dried with a clean gauze.  Using a clean technique the botulinum toxin was diluted with 2.5 mL of bacteriostatic saline per 100 unit vial which resulted in 4 units per 0.1 mL.  Subsequently the mixture was injected in the glabellar, lateral canthal lines, forehead area with preservation of the temporal branch to the lateral eyebrow. A total of 32 Units of botulinum toxin was used. The forehead and glabellar area was injected with care to inject intramuscular only while holding pressure on the supratrochlear vessels in each area during each injection on either side of the medial corrugators. The injection proceeded vertically superiorly to the medial 2/3 of the frontalis muscle and superior 2/3 of the lateral frontalis, again with preservation of the frontal branch.  No complications were noted. She was instructed in post-operative care.  Botox LOT:  [id number]

## 2024-01-11 ENCOUNTER — Encounter (HOSPITAL_COMMUNITY): Payer: Self-pay

## 2024-01-11 ENCOUNTER — Ambulatory Visit (HOSPITAL_COMMUNITY)
Admission: EM | Admit: 2024-01-11 | Discharge: 2024-01-11 | Disposition: A | Discharge status: Home / Self Care | Attending: Family Medicine | Admitting: Family Medicine

## 2024-01-11 DIAGNOSIS — R040 Epistaxis: Secondary | ICD-10-CM | POA: Diagnosis not present

## 2024-01-11 NOTE — ED Provider Notes (Signed)
 [facility]    CSN: [id number] Arrival date & time: 01/11/24  0801      History   Chief Complaint Chief Complaint  Patient presents with   Epistaxis    HPI Claudia Barrett is a 63 y.o. female.    Epistaxis  Patient is here for intermittent nose bleeds on the left nostril x 2 days.  No known injury or irritation.  It happened this morning while driving as well. It has since stopped.  No bleeding at this time.  No other symptoms noted.   No runny nose, congestion or drainage.  No headache.  Her brother just passed and not sure if stress is a factor.       Past Medical History:  Diagnosis Date   Dyspnea    HLD (hyperlipidemia)    Hypertension    Sciatica     Patient Active Problem List   Diagnosis Date Noted   Chest pain 01/02/2017   HLD (hyperlipidemia)    Hypertension    Sciatica    Breast hypertrophy in female 01/09/2016    Past Surgical History:  Procedure Laterality Date   ABDOMINAL HYSTERECTOMY     BREAST REDUCTION SURGERY Bilateral 06/18/2021   Procedure: MAMMARY REDUCTION  (BREAST);  Surgeon: Barb Bonito, MD;  Location: [facility];  Service: Plastics;  Laterality: Bilateral;   BREAST SURGERY      OB History   No obstetric history on file.      Home Medications    Prior to Admission medications   Medication Sig Start Date End Date Taking? Authorizing Provider  amLODipine  (NORVASC ) 10 MG tablet Take 1 tablet (10 mg total) by mouth daily. 01/02/17   Singh, Prashant K, MD  chlorthalidone (HYGROTON) 25 MG tablet Take 25 mg by mouth as needed. swelling 03/06/21   [provider]  levocetirizine (XYZAL) 5 MG tablet as needed. Allergies, sinus 05/25/21   [provider]  meclizine  (ANTIVERT ) 25 MG tablet Take 1 tablet (25 mg total) by mouth 3 (three) times daily as needed for dizziness. 03/20/22   Charmayne Cooper, MD  Naproxen  Sodium (ALEVE  PO) Take by mouth as needed. Sciatic nerve pain    [provider]  pantoprazole (PROTONIX) 40 MG tablet Take 40 mg by mouth daily. 05/26/21   [provider]  rosuvastatin (CRESTOR) 10 MG tablet Take 10 mg by mouth daily. 05/26/21   [provider]  Semaglutide -Weight Management 0.5 MG/0.5ML SOAJ Inject 0.5 mg into the skin once a week. 03/17/23     tiZANidine  (ZANAFLEX ) 4 MG tablet Take 1 tablet (4 mg total) by mouth every 8 (eight) hours as needed for muscle spasms. 04/04/22   Cassandra Cleveland, MD    Family History Family History  Problem Relation Age of Onset   Hypertension Mother    Arthritis Mother    Arrhythmia Father        needed pacemaker   Heart attack Neg Hx     Social History Social History   Tobacco Use   Smoking status: Never   Smokeless tobacco: Never  Vaping Use   Vaping status: Never Used  Substance Use Topics   Alcohol use: No   Drug use: No     Allergies   Codeine and Elemental sulfur   Review of Systems Review of Systems  Constitutional: Negative.   HENT:  Positive for nosebleeds.   Respiratory: Negative.    Cardiovascular: Negative.   Gastrointestinal: Negative.   Musculoskeletal: Negative.  Psychiatric/Behavioral: Negative.       Physical Exam Triage Vital Signs ED Triage Vitals  Encounter Vitals Group     BP 01/11/24 0816 (!) 145/78     Systolic BP Percentile --      Diastolic BP Percentile --      Pulse Rate 01/11/24 0816 76     Resp 01/11/24 0816 16     Temp 01/11/24 0816 (!) 97.4 F (36.3 C)     Temp Source 01/11/24 0816 Oral     SpO2 01/11/24 0816 98 %     Weight --      Height --      Head Circumference --      Peak Flow --      Pain Score 01/11/24 0815 2     Pain Loc --      Pain Education --      Exclude from Growth Chart --    No data found.  Updated Vital Signs BP (!) 145/78 (BP Location: Right Arm)   Pulse 76   Temp (!) 97.4 F (36.3 C) (Oral)   Resp 16   SpO2 98%   Visual Acuity Right Eye Distance:   Left Eye Distance:   Bilateral  Distance:    Right Eye Near:   Left Eye Near:    Bilateral Near:     Physical Exam Constitutional:      General: She is not in acute distress.    Appearance: Normal appearance. She is normal weight. She is not ill-appearing or toxic-appearing.  HENT:     Nose: Nose normal.     Right Turbinates: Not swollen.     Left Turbinates: Not swollen.     Mouth/Throat:     Mouth: Mucous membranes are moist.  Cardiovascular:     Rate and Rhythm: Normal rate.  Pulmonary:     Effort: Pulmonary effort is normal.  Musculoskeletal:     Cervical back: Normal range of motion.  Skin:    General: Skin is warm.  Neurological:     General: No focal deficit present.     Mental Status: She is alert.  Psychiatric:        Mood and Affect: Mood normal.     UC Treatments / Results  Labs (all labs ordered are listed, but only abnormal results are displayed) Labs Reviewed - No data to display  EKG   Radiology No results found.  Procedures Procedures (including critical care time)  Medications Ordered in UC Medications - No data to display  Initial Impression / Assessment and Plan / UC Course  I have reviewed the triage vital signs and the nursing notes.  Pertinent labs & imaging results that were available during my care of the patient were reviewed by me and considered in my medical decision making (see chart for details).    Final Clinical Impressions(s) / UC Diagnoses   Final diagnoses:  Epistaxis     Discharge Instructions      You were seen today for recurrent nose bleeds.  I have placed a referral to ENT Dr. Virgia Griffins.  Please call their office for an appointment at [phone].  If you have worsening bleeding that is not controlled, then please go to the ER for further evaluation.     ED Prescriptions   None    PDMP not reviewed this encounter.   Lesle Ras, MD 01/11/24 [phone]

## 2024-01-11 NOTE — Discharge Instructions (Addendum)
 You were seen today for recurrent nose bleeds.  I have placed a referral to ENT Dr. Virgia Griffins.  Please call their office for an appointment at [phone].  If you have worsening bleeding that is not controlled, then please go to the ER for further evaluation.

## 2024-01-11 NOTE — ED Triage Notes (Signed)
 Patient states she has had intermittent nosebleeds x 2 days. Patient states it is always he left nostril.

## 2024-01-12 ENCOUNTER — Encounter (INDEPENDENT_AMBULATORY_CARE_PROVIDER_SITE_OTHER): Payer: Self-pay

## 2024-01-14 ENCOUNTER — Institutional Professional Consult (permissible substitution) (INDEPENDENT_AMBULATORY_CARE_PROVIDER_SITE_OTHER)

## 2024-02-16 ENCOUNTER — Ambulatory Visit (INDEPENDENT_AMBULATORY_CARE_PROVIDER_SITE_OTHER): Admitting: Physician Assistant

## 2024-02-16 ENCOUNTER — Encounter (INDEPENDENT_AMBULATORY_CARE_PROVIDER_SITE_OTHER): Payer: Self-pay | Admitting: Physician Assistant

## 2024-02-16 VITALS — BP 139/84 | HR 73

## 2024-02-16 DIAGNOSIS — R040 Epistaxis: Secondary | ICD-10-CM | POA: Diagnosis not present

## 2024-02-16 MED ORDER — BACITRACIN 500 UNIT/GM EX OINT: 1.0000 | TOPICAL_OINTMENT | Freq: Two times a day (BID) | CUTANEOUS | 0 refills | Status: AC

## 2024-02-16 NOTE — Progress Notes (Signed)
 Dear Dr. Raquel Cables, Here is my assessment for our mutual patient, Claudia Barrett. Thank you for allowing me the opportunity to care for your patient. Please do not hesitate to contact me should you have any other questions. Sincerely, Belma Boxer PA-C  Otolaryngology Clinic Note Referring provider: Dr. Raquel Cables HPI:  Claudia Barrett is a 63 y.o. female kindly referred by Dr. Raquel Cables   The patient is a 63 YOF seen today for epistaxis. She notes that at the beginning of the month her bother passed away. She notes around the same time she started to have left sided epistaxis. She denies any history of the same. Denies right sided symptoms. They resolve on their own without intervention. She denies any pain, trauma, bleeding disorders. She reports a history of seasonal allergies but does not take anything for it. She does report that she has been crying a lot and rubbing her nose.    Independent Review of Additional Tests or Records:  none   PMH/Meds/All/SocHx/FamHx/ROS:   Past Medical History:  Diagnosis Date   Dyspnea    HLD (hyperlipidemia)    Hypertension    Sciatica      Past Surgical History:  Procedure Laterality Date   ABDOMINAL HYSTERECTOMY     BREAST REDUCTION SURGERY Bilateral 06/18/2021   Procedure: MAMMARY REDUCTION  (BREAST);  Surgeon: Barb Bonito, MD;  Location: [facility];  Service: Plastics;  Laterality: Bilateral;   BREAST SURGERY      Family History  Problem Relation Age of Onset   Hypertension Mother    Arthritis Mother    Arrhythmia Father        needed pacemaker   Heart attack Neg Hx      Social Connections: Unknown (01/26/2022)   Received from [facility]   Social Network    Social Network: Not on file      Current Outpatient Medications:    amLODipine  (NORVASC ) 10 MG tablet, Take 1 tablet (10 mg total) by mouth daily., Disp: 30 tablet, Rfl: 0   chlorthalidone (HYGROTON) 25 MG tablet, Take 25 mg by mouth as needed.  swelling, Disp: , Rfl:    levocetirizine (XYZAL) 5 MG tablet, as needed. Allergies, sinus, Disp: , Rfl:    meclizine  (ANTIVERT ) 25 MG tablet, Take 1 tablet (25 mg total) by mouth 3 (three) times daily as needed for dizziness., Disp: 30 tablet, Rfl: 0   Naproxen  Sodium (ALEVE  PO), Take by mouth as needed. Sciatic nerve pain, Disp: , Rfl:    pantoprazole (PROTONIX) 40 MG tablet, Take 40 mg by mouth daily., Disp: , Rfl:    rosuvastatin (CRESTOR) 10 MG tablet, Take 10 mg by mouth daily., Disp: , Rfl:    Semaglutide -Weight Management 0.5 MG/0.5ML SOAJ, Inject 0.5 mg into the skin once a week., Disp: 2 mL, Rfl: 5   Physical Exam:   BP 139/84   Pulse 73   SpO2 97%   Pertinent Findings  CN II-XII intact Bilateral EAC clear and TM intact with well pneumatized middle ear spaces Anterior rhinoscopy: Septum midline, left septum with superficial blood vessel with small scab over site of bleed. No significant nasal hypertrophy or lesions.  No lesions of oral cavity/oropharynx; dentition wnl No obviously palpable neck masses/lymphadenopathy/thyromegaly No respiratory distress or stridor  Seprately Identifiable Procedures:  PROCEDURE NOTE: Preoperative diagnosis: left sided epistaxis Postoperative diagnosis: same  Procedure: control of nasal hemorrhage anterior, simple (CPT 30901) Indication: Epistaxis EBL: 0 mL Provider: Lorane Rocker PA-C   Procedure: The  patient was identified and properly positioned and verbal consent obtained. Topical lidocaine  and Afrin were applied to the nasal cavity  bilaterally and allowed to work for 10 minutes. The area over the  left anterior septum which was the suspected source of bleeding was focally cauterized using silver nitrate cautery with help of nasal speculum and headlight. There was no significant bleeding afterwards. Mupirocin ointment was applied over the area. Patient tolerated the procedure well   Impression & Plans:  Claudia Barrett is a 63 y.o. female  with the following   Epistaxis-  58 YOF with left sided anterior epistaxis. I suspect this is secondary to a superficial blood vessel that has been irritated with recent rhinorrhea and nasal irrigation. Given obvious source no nasal endoscopy was performed today.  I discussed treatment options including humidification, Vaseline, and saline vs cauterization. She opted to proceed with cautery. Risk and benefits were discussed. She tolerated it well. I would like to see her back in the office in the next few weeks to recheck the area, sooner as needed. She will use bacitracin at the open of nose for 1 week. Return precautions given. She verbalized agreement and understanding to today's plan.    - f/u 2-3 weeks sooner as needed    Thank you for allowing me the opportunity to care for your patient. Please do not hesitate to contact me should you have any other questions.  Sincerely, Belma Boxer PA-C [facility] Phone: [phone] Fax: [phone]  02/16/2024, 2:15 PM
